# Patient Record
Sex: Female | Born: 1935 | Race: White | Hispanic: No | Smoking: Former smoker
Health system: Southern US, Community
[De-identification: ages and names within clinical notes are randomized; demographics above are authoritative.]

## PROBLEM LIST (undated history)

## (undated) DIAGNOSIS — E785 Hyperlipidemia, unspecified: Secondary | ICD-10-CM

## (undated) DIAGNOSIS — H3530 Unspecified macular degeneration: Secondary | ICD-10-CM

## (undated) HISTORY — PX: EYE SURGERY: SHX253

## (undated) HISTORY — PX: TONSILLECTOMY: SUR1361

---

## 2005-06-14 ENCOUNTER — Ambulatory Visit: Payer: Self-pay | Admitting: Internal Medicine

## 2005-11-25 ENCOUNTER — Ambulatory Visit: Payer: Self-pay | Admitting: Gastroenterology

## 2006-06-20 ENCOUNTER — Ambulatory Visit: Payer: Self-pay | Admitting: Internal Medicine

## 2006-06-23 ENCOUNTER — Ambulatory Visit: Payer: Self-pay | Admitting: Internal Medicine

## 2007-06-25 ENCOUNTER — Ambulatory Visit: Payer: Self-pay | Admitting: Internal Medicine

## 2008-06-26 ENCOUNTER — Ambulatory Visit: Payer: Self-pay | Admitting: Internal Medicine

## 2009-06-29 ENCOUNTER — Ambulatory Visit: Payer: Self-pay | Admitting: Internal Medicine

## 2010-07-13 ENCOUNTER — Ambulatory Visit: Payer: Self-pay | Admitting: Internal Medicine

## 2011-05-06 ENCOUNTER — Ambulatory Visit: Payer: Self-pay | Admitting: Ophthalmology

## 2011-05-16 ENCOUNTER — Ambulatory Visit: Payer: Self-pay | Admitting: Ophthalmology

## 2011-07-04 ENCOUNTER — Ambulatory Visit: Payer: Self-pay | Admitting: Ophthalmology

## 2011-08-10 ENCOUNTER — Ambulatory Visit: Payer: Self-pay | Admitting: Internal Medicine

## 2012-08-16 ENCOUNTER — Ambulatory Visit: Payer: Self-pay | Admitting: Internal Medicine

## 2013-08-19 ENCOUNTER — Ambulatory Visit: Payer: Self-pay | Admitting: Internal Medicine

## 2014-07-16 DIAGNOSIS — N390 Urinary tract infection, site not specified: Secondary | ICD-10-CM | POA: Insufficient documentation

## 2014-08-21 ENCOUNTER — Ambulatory Visit: Payer: Self-pay | Admitting: Internal Medicine

## 2017-06-20 ENCOUNTER — Other Ambulatory Visit: Payer: Self-pay | Admitting: Family Medicine

## 2017-06-20 DIAGNOSIS — R590 Localized enlarged lymph nodes: Secondary | ICD-10-CM

## 2017-06-30 ENCOUNTER — Ambulatory Visit
Admission: RE | Admit: 2017-06-30 | Discharge: 2017-06-30 | Disposition: A | Discharge status: Home / Self Care | Payer: Medicare Other | Source: Ambulatory Visit | Attending: Family Medicine | Admitting: Family Medicine

## 2017-06-30 DIAGNOSIS — I700 Atherosclerosis of aorta: Secondary | ICD-10-CM | POA: Insufficient documentation

## 2017-06-30 DIAGNOSIS — R590 Localized enlarged lymph nodes: Secondary | ICD-10-CM | POA: Insufficient documentation

## 2017-06-30 DIAGNOSIS — I2510 Atherosclerotic heart disease of native coronary artery without angina pectoris: Secondary | ICD-10-CM | POA: Insufficient documentation

## 2017-06-30 LAB — POCT I-STAT CREATININE: Creatinine, Ser: 0.7 mg/dL (ref 0.44–1.00)

## 2017-06-30 MED ORDER — IOPAMIDOL (ISOVUE-300) INJECTION 61%
75.0000 mL | Freq: Once | INTRAVENOUS | Status: AC | PRN
  Administered 2017-06-30: 75 mL via INTRAVENOUS

## 2017-07-03 DIAGNOSIS — E782 Mixed hyperlipidemia: Secondary | ICD-10-CM | POA: Insufficient documentation

## 2017-08-03 DIAGNOSIS — R7989 Other specified abnormal findings of blood chemistry: Secondary | ICD-10-CM | POA: Insufficient documentation

## 2018-03-04 DIAGNOSIS — M1611 Unilateral primary osteoarthritis, right hip: Secondary | ICD-10-CM | POA: Insufficient documentation

## 2018-05-02 ENCOUNTER — Encounter
Admission: RE | Admit: 2018-05-02 | Discharge: 2018-05-02 | Disposition: A | Discharge status: Home / Self Care | Payer: Medicare Other | Source: Ambulatory Visit | Attending: Orthopedic Surgery | Admitting: Orthopedic Surgery

## 2018-05-02 ENCOUNTER — Other Ambulatory Visit: Payer: Self-pay

## 2018-05-02 DIAGNOSIS — Z01810 Encounter for preprocedural cardiovascular examination: Secondary | ICD-10-CM | POA: Insufficient documentation

## 2018-05-02 DIAGNOSIS — Z01812 Encounter for preprocedural laboratory examination: Secondary | ICD-10-CM | POA: Diagnosis present

## 2018-05-02 DIAGNOSIS — E7800 Pure hypercholesterolemia, unspecified: Secondary | ICD-10-CM | POA: Insufficient documentation

## 2018-05-02 HISTORY — DX: Unspecified macular degeneration: H35.30

## 2018-05-02 HISTORY — DX: Hyperlipidemia, unspecified: E78.5

## 2018-05-02 LAB — CBC
HCT: 41.8 % (ref 35.0–47.0)
Hemoglobin: 13.9 g/dL (ref 12.0–16.0)
MCH: 31.6 pg (ref 26.0–34.0)
MCHC: 33.3 g/dL (ref 32.0–36.0)
MCV: 94.7 fL (ref 80.0–100.0)
PLATELETS: 203 10*3/uL (ref 150–440)
RBC: 4.41 MIL/uL (ref 3.80–5.20)
RDW: 14 % (ref 11.5–14.5)
WBC: 8.1 10*3/uL (ref 3.6–11.0)

## 2018-05-02 LAB — APTT: aPTT: 26 seconds (ref 24–36)

## 2018-05-02 LAB — TYPE AND SCREEN
ABO/RH(D): O POS
ANTIBODY SCREEN: NEGATIVE

## 2018-05-02 LAB — SURGICAL PCR SCREEN
MRSA, PCR: NEGATIVE
STAPHYLOCOCCUS AUREUS: NEGATIVE

## 2018-05-02 LAB — COMPREHENSIVE METABOLIC PANEL
ALT: 11 U/L — ABNORMAL LOW (ref 14–54)
AST: 19 U/L (ref 15–41)
Albumin: 4.9 g/dL (ref 3.5–5.0)
Alkaline Phosphatase: 74 U/L (ref 38–126)
Anion gap: 10 (ref 5–15)
BUN: 16 mg/dL (ref 6–20)
CO2: 26 mmol/L (ref 22–32)
Calcium: 9.1 mg/dL (ref 8.9–10.3)
Chloride: 105 mmol/L (ref 101–111)
Creatinine, Ser: 0.69 mg/dL (ref 0.44–1.00)
Glucose, Bld: 99 mg/dL (ref 65–99)
POTASSIUM: 3.5 mmol/L (ref 3.5–5.1)
Sodium: 141 mmol/L (ref 135–145)
Total Bilirubin: 1 mg/dL (ref 0.3–1.2)
Total Protein: 7.5 g/dL (ref 6.5–8.1)

## 2018-05-02 LAB — C-REACTIVE PROTEIN

## 2018-05-02 LAB — PROTIME-INR
INR: 0.94
PROTHROMBIN TIME: 12.5 s (ref 11.4–15.2)

## 2018-05-02 LAB — SEDIMENTATION RATE: Sed Rate: 9 mm/hr (ref 0–30)

## 2018-05-02 NOTE — Pre-Procedure Instructions (Signed)
Urine specimen cup and wipes provided to pt and caregiver with instructions for cc and storage in refrigerator before transport. Pt will drop off urine sample at PAT before Ortho appt on 6/18.

## 2018-05-02 NOTE — Patient Instructions (Signed)
  Your procedure is scheduled on: Monday May 14, 2018 Report to Same Day Surgery 2nd floor medical mall (Medical Mall Entrance-take elevator on left to 2nd floor.  Check in with surgery information desk.) To find out your arrival time please call [phone] between 1PM - 3PM on Friday May 11, 2018  Remember: Instructions that are not followed completely may result in serious medical risk, up to and including death, or upon the discretion of your surgeon and anesthesiologist your surgery may need to be rescheduled.    _x___ 1. Do not eat food after midnight the night before your procedure. You may drink clear liquids up to 2 hours before you are scheduled to arrive at the hospital for your procedure.  Do not drink clear liquids within 2 hours of your scheduled arrival to the hospital.  Clear liquids include  --Water or Apple juice without pulp  --Clear carbohydrate beverage such as Gatorade  --Black Coffee or Clear Tea (No milk, no creamers, do not add anything to the coffee or tea   No gum chewing or hard candies.      __x__ 2. No Alcohol for 24 hours before or after surgery.   __x__3. No Smoking or e-cigarettes for 24 prior to surgery.  Do not use any chewable tobacco products for at least 6 hour prior to surgery   ____  4. Bring all medications with you on the day of surgery if instructed.    __x__ 5. Notify your doctor if there is any change in your medical condition     (cold, fever, infections).   __x__6. On the morning of surgery brush your teeth with toothpaste and water.  You may rinse your mouth with mouth wash if you wish.  Do not swallow any toothpaste or mouthwash.   Do not wear jewelry, make-up, hairpins, clips or nail polish.  Do not wear lotions, powders, deodorant, or perfumes.   Do not shave 48 hours prior to surgery.   Do not bring valuables to the hospital.    [facility] any belongings or valuables.               Contacts, dentures or  bridgework may not be worn into surgery.  Leave your suitcase in the car. After surgery it may be brought to your room.  For patients admitted to the hospital, discharge time is determined by your treatment team.  Please read over the following fact sheets that you were given:   [facility] Preparing for Surgery and or MRSA Information   _x___ Take anti-hypertensive listed below, cardiac, seizure, asthma, anti-reflux and psychiatric medicines. These include:  1. Nitrofurantoin/Macrodantin  2. Pravastatin/Pravachol  _x___ Use CHG Soap or sage wipes as directed on instruction sheet   _x___ Follow recommendations from Cardiologist, Pulmonologist or PCP regarding stopping Aspirin, Coumadin, Plavix ,Eliquis, Effient, or Pradaxa, and Pletal.  _x___ Stop Anti-inflammatories such as Advil, Aleve, Ibuprofen, Motrin, Naproxen, Naprosyn, Goodies powders or aspirin products. OK to take Tylenol and Celebrex.   _x___ Stop supplements until after surgery. But may continue Vitamin D, Vitamin B, and multivitamin.

## 2018-05-08 ENCOUNTER — Ambulatory Visit
Admission: RE | Admit: 2018-05-08 | Discharge: 2018-05-08 | Disposition: A | Discharge status: Home / Self Care | Payer: Medicare Other | Source: Ambulatory Visit | Attending: Orthopedic Surgery | Admitting: Orthopedic Surgery

## 2018-05-08 DIAGNOSIS — E785 Hyperlipidemia, unspecified: Secondary | ICD-10-CM | POA: Insufficient documentation

## 2018-05-08 DIAGNOSIS — H3530 Unspecified macular degeneration: Secondary | ICD-10-CM | POA: Diagnosis not present

## 2018-05-08 DIAGNOSIS — M1990 Unspecified osteoarthritis, unspecified site: Secondary | ICD-10-CM | POA: Insufficient documentation

## 2018-05-08 DIAGNOSIS — Z01812 Encounter for preprocedural laboratory examination: Secondary | ICD-10-CM | POA: Diagnosis not present

## 2018-05-08 LAB — URINALYSIS, ROUTINE W REFLEX MICROSCOPIC
BACTERIA UA: NONE SEEN
Bilirubin Urine: NEGATIVE
Glucose, UA: NEGATIVE mg/dL
Hgb urine dipstick: NEGATIVE
Ketones, ur: NEGATIVE mg/dL
Nitrite: NEGATIVE
PROTEIN: NEGATIVE mg/dL
SPECIFIC GRAVITY, URINE: 1.011 (ref 1.005–1.030)
SQUAMOUS EPITHELIAL / LPF: NONE SEEN (ref 0–5)
pH: 6 (ref 5.0–8.0)

## 2018-05-09 LAB — URINE CULTURE: Culture: NO GROWTH

## 2018-05-13 MED ORDER — CEFAZOLIN SODIUM-DEXTROSE 2-4 GM/100ML-% IV SOLN
2.0000 g | INTRAVENOUS | Status: AC
  Administered 2018-05-14: 2 g via INTRAVENOUS

## 2018-05-13 MED ORDER — SODIUM CHLORIDE 0.9 % IV SOLN
1000.0000 mg | INTRAVENOUS | Status: AC
  Administered 2018-05-14: 1000 mg via INTRAVENOUS
  Filled 2018-05-13: qty 1100

## 2018-05-14 ENCOUNTER — Encounter
Admission: RE | Disposition: A | Discharge status: Skilled Nursing Facility | Payer: Self-pay | Source: Home / Self Care | Attending: Orthopedic Surgery

## 2018-05-14 ENCOUNTER — Inpatient Hospital Stay: Payer: Medicare Other

## 2018-05-14 ENCOUNTER — Encounter: Payer: Self-pay | Admitting: Orthopedic Surgery

## 2018-05-14 ENCOUNTER — Inpatient Hospital Stay: Payer: Medicare Other | Admitting: Anesthesiology

## 2018-05-14 ENCOUNTER — Other Ambulatory Visit: Payer: Self-pay

## 2018-05-14 ENCOUNTER — Inpatient Hospital Stay
Admission: RE | Admit: 2018-05-14 | Discharge: 2018-05-16 | DRG: 470 | Disposition: A | Discharge status: Skilled Nursing Facility | Payer: Medicare Other | Attending: Orthopedic Surgery | Admitting: Orthopedic Surgery

## 2018-05-14 DIAGNOSIS — Z96649 Presence of unspecified artificial hip joint: Secondary | ICD-10-CM

## 2018-05-14 DIAGNOSIS — Z881 Allergy status to other antibiotic agents status: Secondary | ICD-10-CM

## 2018-05-14 DIAGNOSIS — E785 Hyperlipidemia, unspecified: Secondary | ICD-10-CM | POA: Diagnosis present

## 2018-05-14 DIAGNOSIS — H3530 Unspecified macular degeneration: Secondary | ICD-10-CM | POA: Diagnosis present

## 2018-05-14 DIAGNOSIS — Z9842 Cataract extraction status, left eye: Secondary | ICD-10-CM

## 2018-05-14 DIAGNOSIS — Z9841 Cataract extraction status, right eye: Secondary | ICD-10-CM | POA: Diagnosis not present

## 2018-05-14 DIAGNOSIS — M419 Scoliosis, unspecified: Secondary | ICD-10-CM | POA: Diagnosis present

## 2018-05-14 DIAGNOSIS — Z87440 Personal history of urinary (tract) infections: Secondary | ICD-10-CM | POA: Diagnosis not present

## 2018-05-14 DIAGNOSIS — M1611 Unilateral primary osteoarthritis, right hip: Secondary | ICD-10-CM | POA: Diagnosis present

## 2018-05-14 HISTORY — PX: TOTAL HIP ARTHROPLASTY: SHX124

## 2018-05-14 LAB — ABO/RH: ABO/RH(D): O POS

## 2018-05-14 SURGERY — ARTHROPLASTY, HIP, TOTAL,POSTERIOR APPROACH
Anesthesia: Spinal | Site: Hip | Laterality: Right | Wound class: Clean

## 2018-05-14 MED ORDER — DIPHENHYDRAMINE HCL 12.5 MG/5ML PO ELIX: 12.5000 mg | ORAL_SOLUTION | ORAL | Status: DC | PRN

## 2018-05-14 MED ORDER — SENNOSIDES-DOCUSATE SODIUM 8.6-50 MG PO TABS
1.0000 | ORAL_TABLET | Freq: Two times a day (BID) | ORAL | Status: DC
  Administered 2018-05-14 – 2018-05-15 (×3): 1 via ORAL
  Filled 2018-05-14 (×4): qty 1

## 2018-05-14 MED ORDER — CHLORHEXIDINE GLUCONATE 4 % EX LIQD: 60.0000 mL | Freq: Once | CUTANEOUS | Status: DC

## 2018-05-14 MED ORDER — ACETAMINOPHEN 10 MG/ML IV SOLN
INTRAVENOUS | Status: DC | PRN
  Administered 2018-05-14: 1000 mg via INTRAVENOUS

## 2018-05-14 MED ORDER — GABAPENTIN 300 MG PO CAPS
300.0000 mg | ORAL_CAPSULE | Freq: Every day | ORAL | Status: DC
  Administered 2018-05-14 – 2018-05-15 (×2): 300 mg via ORAL
  Filled 2018-05-14 (×2): qty 1

## 2018-05-14 MED ORDER — GABAPENTIN 300 MG PO CAPS
300.0000 mg | ORAL_CAPSULE | Freq: Once | ORAL | Status: AC
  Administered 2018-05-14: 300 mg via ORAL

## 2018-05-14 MED ORDER — METOCLOPRAMIDE HCL 10 MG PO TABS
10.0000 mg | ORAL_TABLET | Freq: Three times a day (TID) | ORAL | Status: AC
  Administered 2018-05-14 – 2018-05-16 (×7): 10 mg via ORAL
  Filled 2018-05-14 (×7): qty 1

## 2018-05-14 MED ORDER — FLEET ENEMA 7-19 GM/118ML RE ENEM: 1.0000 | ENEMA | Freq: Once | RECTAL | Status: DC | PRN

## 2018-05-14 MED ORDER — METOCLOPRAMIDE HCL 10 MG PO TABS: 5.0000 mg | ORAL_TABLET | Freq: Three times a day (TID) | ORAL | Status: DC | PRN

## 2018-05-14 MED ORDER — FAMOTIDINE 20 MG PO TABS
20.0000 mg | ORAL_TABLET | Freq: Once | ORAL | Status: AC
  Administered 2018-05-14: 20 mg via ORAL

## 2018-05-14 MED ORDER — MAGNESIUM HYDROXIDE 400 MG/5ML PO SUSP
30.0000 mL | Freq: Every day | ORAL | Status: DC
  Administered 2018-05-15: 30 mL via ORAL
  Filled 2018-05-14: qty 30

## 2018-05-14 MED ORDER — NEOMYCIN-POLYMYXIN B GU 40-200000 IR SOLN
Status: AC
  Filled 2018-05-14: qty 20

## 2018-05-14 MED ORDER — TRANEXAMIC ACID 1000 MG/10ML IV SOLN
1000.0000 mg | Freq: Once | INTRAVENOUS | Status: AC
  Administered 2018-05-14: 1000 mg via INTRAVENOUS
  Filled 2018-05-14: qty 1100

## 2018-05-14 MED ORDER — ONDANSETRON HCL 4 MG/2ML IJ SOLN: 4.0000 mg | Freq: Once | INTRAMUSCULAR | Status: DC | PRN

## 2018-05-14 MED ORDER — FAMOTIDINE 20 MG PO TABS
ORAL_TABLET | ORAL | Status: AC
  Filled 2018-05-14: qty 1

## 2018-05-14 MED ORDER — WHITE PETROLATUM EX OINT
TOPICAL_OINTMENT | CUTANEOUS | Status: AC
  Filled 2018-05-14: qty 5

## 2018-05-14 MED ORDER — PANTOPRAZOLE SODIUM 40 MG PO TBEC
40.0000 mg | DELAYED_RELEASE_TABLET | Freq: Two times a day (BID) | ORAL | Status: DC
  Administered 2018-05-14 – 2018-05-16 (×4): 40 mg via ORAL
  Filled 2018-05-14 (×4): qty 1

## 2018-05-14 MED ORDER — ACETAMINOPHEN 325 MG PO TABS
325.0000 mg | ORAL_TABLET | Freq: Four times a day (QID) | ORAL | Status: DC | PRN
  Administered 2018-05-16: 650 mg via ORAL
  Filled 2018-05-14 (×2): qty 2

## 2018-05-14 MED ORDER — BUPIVACAINE HCL (PF) 0.5 % IJ SOLN
INTRAMUSCULAR | Status: DC | PRN
  Administered 2018-05-14: 2.7 mL

## 2018-05-14 MED ORDER — ACETAMINOPHEN 10 MG/ML IV SOLN
1000.0000 mg | Freq: Four times a day (QID) | INTRAVENOUS | Status: AC
  Administered 2018-05-14 – 2018-05-15 (×3): 1000 mg via INTRAVENOUS
  Filled 2018-05-14 (×6): qty 100

## 2018-05-14 MED ORDER — DEXAMETHASONE SODIUM PHOSPHATE 10 MG/ML IJ SOLN
8.0000 mg | Freq: Once | INTRAMUSCULAR | Status: AC
  Administered 2018-05-14: 8 mg via INTRAVENOUS

## 2018-05-14 MED ORDER — CELECOXIB 200 MG PO CAPS
ORAL_CAPSULE | ORAL | Status: AC
  Filled 2018-05-14: qty 2

## 2018-05-14 MED ORDER — FENTANYL CITRATE (PF) 100 MCG/2ML IJ SOLN: 25.0000 ug | INTRAMUSCULAR | Status: DC | PRN

## 2018-05-14 MED ORDER — PROPOFOL 500 MG/50ML IV EMUL
INTRAVENOUS | Status: AC
  Filled 2018-05-14: qty 50

## 2018-05-14 MED ORDER — LACTATED RINGERS IV SOLN: INTRAVENOUS | Status: DC

## 2018-05-14 MED ORDER — NEOMYCIN-POLYMYXIN B GU 40-200000 IR SOLN
Status: DC | PRN
  Administered 2018-05-14: 14 mL

## 2018-05-14 MED ORDER — VITAMIN D 1000 UNITS PO TABS
5000.0000 [IU] | ORAL_TABLET | Freq: Every day | ORAL | Status: DC
  Administered 2018-05-15 – 2018-05-16 (×2): 5000 [IU] via ORAL
  Filled 2018-05-14 (×2): qty 5

## 2018-05-14 MED ORDER — LIDOCAINE HCL (PF) 2 % IJ SOLN
INTRAMUSCULAR | Status: AC
  Filled 2018-05-14: qty 10

## 2018-05-14 MED ORDER — OXYCODONE HCL 5 MG PO TABS: 5.0000 mg | ORAL_TABLET | ORAL | Status: DC | PRN

## 2018-05-14 MED ORDER — ADULT MULTIVITAMIN W/MINERALS CH
1.0000 | ORAL_TABLET | Freq: Every day | ORAL | Status: DC
  Administered 2018-05-15 – 2018-05-16 (×2): 1 via ORAL
  Filled 2018-05-14 (×2): qty 1

## 2018-05-14 MED ORDER — PHENYLEPHRINE HCL 10 MG/ML IJ SOLN
INTRAMUSCULAR | Status: AC
  Filled 2018-05-14: qty 1

## 2018-05-14 MED ORDER — CELECOXIB 200 MG PO CAPS
400.0000 mg | ORAL_CAPSULE | Freq: Once | ORAL | Status: AC
  Administered 2018-05-14: 400 mg via ORAL

## 2018-05-14 MED ORDER — ONDANSETRON HCL 4 MG/2ML IJ SOLN: 4.0000 mg | Freq: Four times a day (QID) | INTRAMUSCULAR | Status: DC | PRN

## 2018-05-14 MED ORDER — PROPOFOL 10 MG/ML IV BOLUS
INTRAVENOUS | Status: DC | PRN
  Administered 2018-05-14 (×4): 11 mg via INTRAVENOUS

## 2018-05-14 MED ORDER — GLYCOPYRROLATE 0.2 MG/ML IJ SOLN
INTRAMUSCULAR | Status: DC | PRN
  Administered 2018-05-14: 0.2 mg via INTRAVENOUS

## 2018-05-14 MED ORDER — OXYCODONE HCL 5 MG PO TABS: 10.0000 mg | ORAL_TABLET | ORAL | Status: DC | PRN

## 2018-05-14 MED ORDER — ACETAMINOPHEN 10 MG/ML IV SOLN
INTRAVENOUS | Status: AC
  Filled 2018-05-14: qty 100

## 2018-05-14 MED ORDER — DEXAMETHASONE SODIUM PHOSPHATE 10 MG/ML IJ SOLN
INTRAMUSCULAR | Status: AC
  Filled 2018-05-14: qty 1

## 2018-05-14 MED ORDER — SODIUM CHLORIDE 0.9 % IV SOLN
INTRAVENOUS | Status: DC
  Administered 2018-05-14 – 2018-05-15 (×3): via INTRAVENOUS

## 2018-05-14 MED ORDER — CEFAZOLIN SODIUM-DEXTROSE 2-4 GM/100ML-% IV SOLN
2.0000 g | Freq: Four times a day (QID) | INTRAVENOUS | Status: AC
  Administered 2018-05-14 – 2018-05-15 (×3): 2 g via INTRAVENOUS
  Filled 2018-05-14 (×4): qty 100

## 2018-05-14 MED ORDER — MENTHOL 3 MG MT LOZG
1.0000 | LOZENGE | OROMUCOSAL | Status: DC | PRN
  Filled 2018-05-14: qty 9

## 2018-05-14 MED ORDER — PRAVASTATIN SODIUM 20 MG PO TABS
40.0000 mg | ORAL_TABLET | Freq: Every day | ORAL | Status: DC
  Administered 2018-05-14 – 2018-05-15 (×2): 40 mg via ORAL
  Filled 2018-05-14 (×2): qty 2

## 2018-05-14 MED ORDER — ALUM & MAG HYDROXIDE-SIMETH 200-200-20 MG/5ML PO SUSP: 30.0000 mL | ORAL | Status: DC | PRN

## 2018-05-14 MED ORDER — HYDROMORPHONE HCL 1 MG/ML IJ SOLN: 0.5000 mg | INTRAMUSCULAR | Status: DC | PRN

## 2018-05-14 MED ORDER — GABAPENTIN 300 MG PO CAPS
ORAL_CAPSULE | ORAL | Status: AC
  Filled 2018-05-14: qty 1

## 2018-05-14 MED ORDER — ONDANSETRON HCL 4 MG PO TABS: 4.0000 mg | ORAL_TABLET | Freq: Four times a day (QID) | ORAL | Status: DC | PRN

## 2018-05-14 MED ORDER — CLOBETASOL PROPIONATE 0.05 % EX OINT
1.0000 "application " | TOPICAL_OINTMENT | Freq: Two times a day (BID) | CUTANEOUS | Status: DC
  Administered 2018-05-14 – 2018-05-15 (×2): 1 via TOPICAL
  Filled 2018-05-14: qty 15

## 2018-05-14 MED ORDER — FENTANYL CITRATE (PF) 100 MCG/2ML IJ SOLN
INTRAMUSCULAR | Status: DC | PRN
  Administered 2018-05-14: 50 ug via INTRAVENOUS

## 2018-05-14 MED ORDER — CEFAZOLIN SODIUM-DEXTROSE 2-4 GM/100ML-% IV SOLN
INTRAVENOUS | Status: AC
  Filled 2018-05-14: qty 100

## 2018-05-14 MED ORDER — PROPOFOL 500 MG/50ML IV EMUL
INTRAVENOUS | Status: DC | PRN
  Administered 2018-05-14: 60 ug/kg/min via INTRAVENOUS

## 2018-05-14 MED ORDER — SODIUM CHLORIDE 0.9 % IV SOLN
INTRAVENOUS | Status: DC | PRN
  Administered 2018-05-14 (×2): via INTRAVENOUS

## 2018-05-14 MED ORDER — FENTANYL CITRATE (PF) 100 MCG/2ML IJ SOLN
INTRAMUSCULAR | Status: AC
  Filled 2018-05-14: qty 2

## 2018-05-14 MED ORDER — ENOXAPARIN SODIUM 30 MG/0.3ML ~~LOC~~ SOLN
30.0000 mg | Freq: Two times a day (BID) | SUBCUTANEOUS | Status: DC
  Administered 2018-05-15 – 2018-05-16 (×3): 30 mg via SUBCUTANEOUS
  Filled 2018-05-14 (×3): qty 0.3

## 2018-05-14 MED ORDER — METOCLOPRAMIDE HCL 5 MG/ML IJ SOLN: 5.0000 mg | Freq: Three times a day (TID) | INTRAMUSCULAR | Status: DC | PRN

## 2018-05-14 MED ORDER — BISACODYL 10 MG RE SUPP: 10.0000 mg | Freq: Every day | RECTAL | Status: DC | PRN

## 2018-05-14 MED ORDER — SODIUM CHLORIDE 0.9 % IV SOLN
INTRAVENOUS | Status: DC | PRN
  Administered 2018-05-14: 30 ug/min via INTRAVENOUS

## 2018-05-14 MED ORDER — TRAMADOL HCL 50 MG PO TABS
50.0000 mg | ORAL_TABLET | ORAL | Status: DC | PRN
  Administered 2018-05-14: 50 mg via ORAL
  Filled 2018-05-14: qty 1

## 2018-05-14 MED ORDER — NITROFURANTOIN MACROCRYSTAL 50 MG PO CAPS
100.0000 mg | ORAL_CAPSULE | Freq: Every day | ORAL | Status: DC
  Administered 2018-05-15 – 2018-05-16 (×2): 100 mg via ORAL
  Filled 2018-05-14 (×3): qty 2

## 2018-05-14 MED ORDER — CELECOXIB 200 MG PO CAPS
200.0000 mg | ORAL_CAPSULE | Freq: Two times a day (BID) | ORAL | Status: DC
  Administered 2018-05-14 – 2018-05-16 (×4): 200 mg via ORAL
  Filled 2018-05-14 (×4): qty 1

## 2018-05-14 MED ORDER — GLYCOPYRROLATE 0.2 MG/ML IJ SOLN
INTRAMUSCULAR | Status: AC
  Filled 2018-05-14: qty 1

## 2018-05-14 MED ORDER — FERROUS SULFATE 325 (65 FE) MG PO TABS
325.0000 mg | ORAL_TABLET | Freq: Two times a day (BID) | ORAL | Status: DC
  Administered 2018-05-15 – 2018-05-16 (×3): 325 mg via ORAL
  Filled 2018-05-14 (×3): qty 1

## 2018-05-14 MED ORDER — PHENOL 1.4 % MT LIQD
1.0000 | OROMUCOSAL | Status: DC | PRN
  Filled 2018-05-14: qty 177

## 2018-05-14 SURGICAL SUPPLY — 55 items
BLADE DRUM FLTD (BLADE) ×3 IMPLANT
BLADE SAW 1 (BLADE) ×3 IMPLANT
CANISTER SUCT 1200ML W/VALVE (MISCELLANEOUS) ×3 IMPLANT
CANISTER SUCT 3000ML PPV (MISCELLANEOUS) ×6 IMPLANT
CAPT HIP TOTAL 2 ×3 IMPLANT
CARTRIDGE OIL MAESTRO DRILL (MISCELLANEOUS) ×1 IMPLANT
DIFFUSER DRILL AIR PNEUMATIC (MISCELLANEOUS) ×3 IMPLANT
DRAPE INCISE IOBAN 66X60 STRL (DRAPES) ×3 IMPLANT
DRAPE SHEET LG 3/4 BI-LAMINATE (DRAPES) ×3 IMPLANT
DRSG DERMACEA 8X12 NADH (GAUZE/BANDAGES/DRESSINGS) ×3 IMPLANT
DRSG OPSITE POSTOP 4X12 (GAUZE/BANDAGES/DRESSINGS) ×3 IMPLANT
DRSG OPSITE POSTOP 4X14 (GAUZE/BANDAGES/DRESSINGS) IMPLANT
DRSG TEGADERM 4X4.75 (GAUZE/BANDAGES/DRESSINGS) ×3 IMPLANT
DURAPREP 26ML APPLICATOR (WOUND CARE) ×6 IMPLANT
ELECT BLADE 6.5 EXT (BLADE) ×3 IMPLANT
ELECT CAUTERY BLADE 6.4 (BLADE) ×3 IMPLANT
GLOVE BIOGEL M STRL SZ7.5 (GLOVE) ×6 IMPLANT
GLOVE BIOGEL PI IND STRL 7.0 (GLOVE) ×4 IMPLANT
GLOVE BIOGEL PI IND STRL 9 (GLOVE) ×1 IMPLANT
GLOVE BIOGEL PI INDICATOR 7.0 (GLOVE) ×8
GLOVE BIOGEL PI INDICATOR 9 (GLOVE) ×2
GLOVE INDICATOR 8.0 STRL GRN (GLOVE) ×3 IMPLANT
GLOVE SURG SYN 9.0  PF PI (GLOVE) ×2
GLOVE SURG SYN 9.0 PF PI (GLOVE) ×1 IMPLANT
GOWN STRL REUS W/ TWL LRG LVL3 (GOWN DISPOSABLE) ×2 IMPLANT
GOWN STRL REUS W/TWL 2XL LVL3 (GOWN DISPOSABLE) ×3 IMPLANT
GOWN STRL REUS W/TWL LRG LVL3 (GOWN DISPOSABLE) ×4
HEMOVAC 400CC 10FR (MISCELLANEOUS) ×3 IMPLANT
HOLDER FOLEY CATH W/STRAP (MISCELLANEOUS) ×3 IMPLANT
HOOD PEEL AWAY FLYTE STAYCOOL (MISCELLANEOUS) ×6 IMPLANT
KIT TURNOVER KIT A (KITS) ×3 IMPLANT
NDL SAFETY ECLIPSE 18X1.5 (NEEDLE) ×1 IMPLANT
NEEDLE HYPO 18GX1.5 SHARP (NEEDLE) ×2
NS IRRIG 500ML POUR BTL (IV SOLUTION) ×3 IMPLANT
OIL CARTRIDGE MAESTRO DRILL (MISCELLANEOUS) ×3
PACK HIP PROSTHESIS (MISCELLANEOUS) ×3 IMPLANT
PIN STEIN THRED 5/32 (Pin) ×3 IMPLANT
PULSAVAC PLUS IRRIG FAN TIP (DISPOSABLE) ×3
SOL .9 NS 3000ML IRR  AL (IV SOLUTION) ×2
SOL .9 NS 3000ML IRR UROMATIC (IV SOLUTION) ×1 IMPLANT
SOL PREP PVP 2OZ (MISCELLANEOUS) ×3
SOLUTION PREP PVP 2OZ (MISCELLANEOUS) ×1 IMPLANT
SPONGE DRAIN TRACH 4X4 STRL 2S (GAUZE/BANDAGES/DRESSINGS) ×3 IMPLANT
STAPLER SKIN PROX 35W (STAPLE) ×3 IMPLANT
SUT ETHIBOND #5 BRAIDED 30INL (SUTURE) ×3 IMPLANT
SUT VIC AB 0 CT1 36 (SUTURE) ×3 IMPLANT
SUT VIC AB 1 CT1 36 (SUTURE) ×6 IMPLANT
SUT VIC AB 2-0 CT1 27 (SUTURE) ×2
SUT VIC AB 2-0 CT1 TAPERPNT 27 (SUTURE) ×1 IMPLANT
SYR 20CC LL (SYRINGE) ×3 IMPLANT
TAPE ADH 3 LX (MISCELLANEOUS) ×3 IMPLANT
TAPE TRANSPORE STRL 2 31045 (GAUZE/BANDAGES/DRESSINGS) ×3 IMPLANT
TIP FAN IRRIG PULSAVAC PLUS (DISPOSABLE) ×1 IMPLANT
TOWEL OR 17X26 4PK STRL BLUE (TOWEL DISPOSABLE) ×3 IMPLANT
TRAY FOLEY MTR SLVR 16FR STAT (SET/KITS/TRAYS/PACK) ×3 IMPLANT

## 2018-05-14 NOTE — Anesthesia Preprocedure Evaluation (Addendum)
Anesthesia Evaluation  Patient identified by MRN, date of birth, ID band Patient awake    Reviewed: Allergy & Precautions, NPO status , Patient's Chart, lab work & pertinent test results  Airway Mallampati: III  TM Distance: <3 FB     Dental   Pulmonary former smoker,    Pulmonary exam normal        Cardiovascular negative cardio ROS Normal cardiovascular exam     Neuro/Psych negative neurological ROS  negative psych ROS   GI/Hepatic negative GI ROS, Neg liver ROS,   Endo/Other  negative endocrine ROS  Renal/GU negative Renal ROS  negative genitourinary   Musculoskeletal  (+) Arthritis , Osteoarthritis,    Abdominal Normal abdominal exam  (+)   Peds negative pediatric ROS (+)  Hematology negative hematology ROS (+)   Anesthesia Other Findings Past Medical History: No date: Hyperlipidemia No date: Macular degeneration  Reproductive/Obstetrics                            Anesthesia Physical Anesthesia Plan  ASA: II  Anesthesia Plan: Spinal   Post-op Pain Management:    Induction: Intravenous  PONV Risk Score and Plan:   Airway Management Planned: Nasal Cannula  Additional Equipment:   Intra-op Plan:   Post-operative Plan:   Informed Consent: I have reviewed the patients History and Physical, chart, labs and discussed the procedure including the risks, benefits and alternatives for the proposed anesthesia with the patient or authorized representative who has indicated his/her understanding and acceptance.   Dental advisory given  Plan Discussed with: CRNA and Surgeon  Anesthesia Plan Comments:         Anesthesia Quick Evaluation

## 2018-05-14 NOTE — Op Note (Signed)
OPERATIVE NOTE  DATE OF SURGERY:  05/14/2018  PATIENT NAME:  Allison Madden   DOB: 01/30/1929  MRN: [id number]  PRE-OPERATIVE DIAGNOSIS: Degenerative arthrosis of the right hip, primary  POST-OPERATIVE DIAGNOSIS:  Same  PROCEDURE:  Right total hip arthroplasty  SURGEON:  Jena GaussJames P Najeeb Uptain, Jr. M.D.  ASSISTANT:  Van ClinesJon Wolfe, PA (present and scrubbed throughout the case, critical for assistance with exposure, retraction, instrumentation, and closure)  ANESTHESIA: spinal  ESTIMATED BLOOD LOSS: 300 mL  FLUIDS REPLACED: 1200 mL of crystalloid  DRAINS: 2 medium drains to a Hemovac reservoir  IMPLANTS UTILIZED: DePuy 12 mm small stature AML femoral stem, 46 mm OD Pinnacle 100 Gription acetabular component, neutral Pinnacle Altrx polyethylene insert, and a 28 mm CoCr +1.5 mm hip ball  INDICATIONS FOR SURGERY: Allison Madden is a 84 y.o. year old female with a long history of progressive hip and groin  pain. X-rays demonstrated severe degenerative changes. The patient had not seen any significant improvement despite conservative nonsurgical intervention. After discussion of the risks and benefits of surgical intervention, the patient expressed understanding of the risks benefits and agree with plans for total hip arthroplasty.   The risks, benefits, and alternatives were discussed at length including but not limited to the risks of infection, bleeding, nerve injury, stiffness, blood clots, the need for revision surgery, limb length inequality, dislocation, cardiopulmonary complications, among others, and they were willing to proceed.  PROCEDURE IN DETAIL: The patient was brought into the operating room and, after adequate spinal anesthesia was achieved, the patient was placed in a left lateral decubitus position. Axillary roll was placed and all bony prominences were well-padded. The patient's right hip was cleaned and prepped with alcohol and DuraPrep and draped in the usual sterile  fashion. A "timeout" was performed as per usual protocol. A lateral curvilinear incision was made gently curving towards the posterior superior iliac spine. The IT band was incised in line with the skin incision and the fibers of the gluteus maximus were split in line. The piriformis tendon was identified, skeletonized, and incised at its insertion to the proximal femur and reflected posteriorly. A T type posterior capsulotomy was performed. Prior to dislocation of the femoral head, a threaded Steinmann pin was inserted through a separate stab incision into the pelvis superior to the acetabulum and bent in the form of a stylus so as to assess limb length and hip offset throughout the procedure. The femoral head was then dislocated posteriorly. Inspection of the femoral head demonstrated severe degenerative changes with full-thickness loss of articular cartilage. The femoral neck cut was performed using an oscillating saw. The anterior capsule was elevated off of the femoral neck using a periosteal elevator. Attention was then directed to the acetabulum. The remnant of the labrum was excised using electrocautery. Inspection of the acetabulum also demonstrated significant degenerative changes. The acetabulum was reamed in sequential fashion up to a 45 mm diameter. Good punctate bleeding bone was encountered. A 46 mm Pinnacle 100 Gription acetabular component was positioned and impacted into place. Good scratch fit was appreciated. A neutral polyethylene trial was inserted.  Attention was then directed to the proximal femur. A hole for reaming of the proximal femoral canal was created using a high-speed burr. The femoral canal was reamed in sequential fashion up to a 11.5 mm diameter. This allowed for approximately 6 cm of scratch fit.  It was thus elected to ream up to a 12 mm diameter to allow for a line to line fit.  Serial broaches were inserted up to a 12 mm small stature femoral broach. Calcar region was planed  and a trial reduction was performed using a 28 mm hip ball with a +1.5 mm neck length.  The right lower extreme was felt to be slightly long so the broach was countersunk several millimeters and the calcar region was again planed.  Trial reduction was again performed using a 28 mm hip ball with a +1.5 mm neck length.  Good equalization of limb lengths and hip offset was appreciated and excellent stability was noted both anteriorly and posteriorly. Trial components were removed. The acetabular shell was irrigated with copious amounts of normal saline with antibiotic solution and suctioned dry. A neutral Pinnacle Altrx polyethylene insert was positioned and impacted into place. Next, a 12 mm small stature AML femoral stem was positioned and impacted into place. Excellent scratch fit was appreciated. A trial reduction was again performed with a 28 mm hip ball with a +1.5 mm neck length. Again, good equalization of limb lengths was appreciated and excellent stability appreciated both anteriorly and posteriorly. The hip was then dislocated and the trial hip ball was removed. The Morse taper was cleaned and dried. A 28 mm cobalt chrome hip ball with a +1.5 mm neck length was placed on the trunnion and impacted into place. The hip was then reduced and placed through range of motion. Excellent stability was appreciated both anteriorly and posteriorly.  The wound was irrigated with copious amounts of normal saline with antibiotic solution and suctioned dry. Good hemostasis was appreciated. The posterior capsulotomy was repaired using #5 Ethibond. Piriformis tendon was reapproximated to the undersurface of the gluteus medius tendon using #5 Ethibond. Two medium drains were placed in the wound bed and brought out through separate stab incisions to be attached to a Hemovac reservoir. The IT band was reapproximated using interrupted sutures of #1 Vicryl. Subcutaneous tissue was approximated using first #0 Vicryl followed by  #2-0 Vicryl. The skin was closed with skin staples.  The patient tolerated the procedure well and was transported to the recovery room in stable condition.   Jena Gauss., M.D.

## 2018-05-14 NOTE — Anesthesia Post-op Follow-up Note (Signed)
Anesthesia QCDR form completed.        

## 2018-05-14 NOTE — Anesthesia Procedure Notes (Signed)
Spinal  Patient location during procedure: OR Start time: 05/14/2018 7:28 AM End time: 05/14/2018 7:31 AM Staffing Resident/CRNA: Bernardo Heater, CRNA Performed: resident/CRNA  Preanesthetic Checklist Completed: patient identified, site marked, surgical consent, pre-op evaluation, timeout performed, IV checked, risks and benefits discussed and monitors and equipment checked Spinal Block Patient position: sitting Prep: Betadine and ChloraPrep Patient monitoring: heart rate, continuous pulse ox, blood pressure and cardiac monitor Approach: midline Location: L4-5 Injection technique: single-shot Needle Needle type: Introducer and Pencan  Needle gauge: 24 G Needle length: 9 cm Additional Notes Negative paresthesia. Negative blood return. Positive free-flowing CSF. Expiration date of kit checked and confirmed. Patient tolerated procedure well, without complications.

## 2018-05-14 NOTE — Transfer of Care (Signed)
Immediate Anesthesia Transfer of Care Note  Patient: Allison Madden  Procedure(s) Performed: TOTAL HIP ARTHROPLASTY (Right Hip)  Patient Location: PACU  Anesthesia Type:Spinal  Level of Consciousness: awake, alert , oriented and patient cooperative  Airway & Oxygen Therapy: Patient Spontanous Breathing and Patient connected to nasal cannula oxygen  Post-op Assessment: Report given to RN and Post -op Vital signs reviewed and stable  Post vital signs: Reviewed and stable  Last Vitals:  Vitals Value Taken Time  BP 124/73 05/14/2018 10:29 AM  Temp    Pulse 66 05/14/2018 10:29 AM  Resp 17 05/14/2018 10:29 AM  SpO2 100 % 05/14/2018 10:29 AM  Vitals shown include unvalidated device data.  Last Pain:  Vitals:   05/14/18 0619  TempSrc:   PainSc: 0-No pain         Complications: No apparent anesthesia complications

## 2018-05-14 NOTE — Progress Notes (Signed)
Patient resting in bed. Dressing dry and intact on right hip. Hemovac in place and intact. IVF infusing. No complaints of pain. Continue to monitor.

## 2018-05-14 NOTE — Evaluation (Signed)
Physical Therapy Evaluation Patient Details Name: Allison Madden MRN: [id number] DOB: July 21, 1929 Today's Date: 05/14/2018   History of Present Illness  Pt is a 84 y/o F s/p R THA.  Pt's PMH includes macular degeneration.   Clinical Impression  Pt is s/p R THA resulting in the deficits listed below (see PT Problem List). Allison Madden was very pleasant and put forth good effort.  She does have a difficult time remembering her posterior precautions and was reminded x3.  She requires min assist for supine>sit and sit<>stand.  Pt reported fatigue after ambulating ~15 ft and ambulated a total of 20 ft.  Given pt's current mobility status, recommending SNF at d/c.  Pt will benefit from skilled PT to increase their independence and safety with mobility to allow discharge to the venue listed below.     Follow Up Recommendations SNF    Equipment Recommendations  Rolling walker with 5" wheels;3in1 (PT)    Recommendations for Other Services OT consult     Precautions / Restrictions Precautions Precautions: Fall;Posterior Hip Precaution Booklet Issued: Yes (comment) Precaution Comments: Reviewed precautions x3 and provided pt with handout (although pt with macular degeneration so packet provided to son).  Pt able to recall only 1/3 hip precautions at end of session.  Restrictions Weight Bearing Restrictions: Yes RLE Weight Bearing: Weight bearing as tolerated      Mobility  Bed Mobility Overal bed mobility: Needs Assistance Bed Mobility: Supine to Sit     Supine to sit: Min assist;HOB elevated     General bed mobility comments: Cues for proper sequencing.  Assist to elevate trunk.  Pt uses bed rail to assist in pulling into sitting.  Well controlled descent to sit.   Transfers Overall transfer level: Needs assistance Equipment used: Rolling walker (2 wheeled) Transfers: Sit to/from Stand Sit to Stand: Min assist;From elevated surface         General transfer comment: Bed  slightly elevated for adherence to hip precautions.  Pt requires min assist to boost to standing and to remain steady.   Ambulation/Gait Ambulation/Gait assistance: Min guard Gait Distance (Feet): 20 Feet Assistive device: Rolling walker (2 wheeled) Gait Pattern/deviations: Step-to pattern;Decreased stance time - right;Decreased step length - left;Decreased weight shift to right;Antalgic Gait velocity: decreased   General Gait Details: Cues for sequencing using RW.  Pt reports fatigue after ambulating ~15 ft.  Slightly flexed posture.   Stairs            Wheelchair Mobility    Modified Rankin (Stroke Patients Only)       Balance Overall balance assessment: Needs assistance Sitting-balance support: No upper extremity supported;Feet supported Sitting balance-Leahy Scale: Fair Sitting balance - Comments: Pt able to sit EOB without UE support but would likely lose her balance with perturbation   Standing balance support: Bilateral upper extremity supported;During functional activity Standing balance-Leahy Scale: Poor Standing balance comment: Pt relies on UE support for static and dynamic activities                             Pertinent Vitals/Pain Pain Assessment: Faces Faces Pain Scale: Hurts little more Pain Location: R hip  Pain Descriptors / Indicators: Grimacing;Guarding;Moaning Pain Intervention(s): Limited activity within patient's tolerance;Monitored during session;Premedicated before session;Repositioned    Home Living Family/patient expects to be discharged to:: Private residence Living Arrangements: Alone Available Help at Discharge: Personal care attendant;Available PRN/intermittently Type of Home: Independent living facility Home Access: Level entry  Home Layout: One level Home Equipment: Shower seat;Grab bars - tub/shower;Walker - 4 wheels;Cane - single point      Prior Function Level of Independence: Needs assistance   Gait /  Transfers Assistance Needed: Pt ambulates with rollator at all times and denies any falls in the past 6 months.    ADL's / Homemaking Assistance Needed: Pt independent with bathing, dressing.  Caregiver does the cleaning, runs the errands, reads the newspaper to the pt.  Meals provided by ILF.   Comments: Pt's caregiver comes M-F 8:30am-2pm     Hand Dominance   Dominant Hand: Left    Extremity/Trunk Assessment   Upper Extremity Assessment Upper Extremity Assessment: Defer to OT evaluation    Lower Extremity Assessment Lower Extremity Assessment: RLE deficits/detail RLE Deficits / Details: Pt able to perform R hip Abd/Add in supine without assist, limited by pain and hip precautions RLE: Unable to fully assess due to pain       Communication   Communication: No difficulties  Cognition Arousal/Alertness: Awake/alert Behavior During Therapy: WFL for tasks assessed/performed Overall Cognitive Status: Impaired/Different from baseline Area of Impairment: Problem solving;Safety/judgement                         Safety/Judgement: Decreased awareness of deficits;Decreased awareness of safety   Problem Solving: Slow processing General Comments: Son reports pt still seems to be affected by the medication and anesthesia.  Pt overall A&O and able to answer questions appropriately and follow commands but with some delay.       General Comments General comments (skin integrity, edema, etc.): BP in supine at start of session: 109/72, sitting EOB 119/70, sitting in chair at end of session 112/68    Exercises Total Joint Exercises Ankle Circles/Pumps: AROM;Both;10 reps;Supine Quad Sets: Strengthening;Both;10 reps;Supine Gluteal Sets: Strengthening;Both;10 reps;Supine Hip ABduction/ADduction: AROM;Right;10 reps;Supine   Assessment/Plan    PT Assessment Patient needs continued PT services  PT Problem List Decreased strength;Decreased range of motion;Decreased activity  tolerance;Decreased balance;Decreased mobility;Decreased knowledge of use of DME;Decreased safety awareness;Decreased knowledge of precautions;Pain       PT Treatment Interventions DME instruction;Gait training;Stair training;Functional mobility training;Therapeutic activities;Therapeutic exercise;Balance training;Neuromuscular re-education;Patient/family education;Wheelchair mobility training;Modalities    PT Goals (Current goals can be found in the Care Plan section)  Acute Rehab PT Goals Patient Stated Goal: to get back to walking PT Goal Formulation: With patient Time For Goal Achievement: 05/28/18 Potential to Achieve Goals: Good    Frequency BID   Barriers to discharge Decreased caregiver support Pt with limited amount of caregiver support    Co-evaluation               AM-PAC PT "6 Clicks" Daily Activity  Outcome Measure Difficulty turning over in bed (including adjusting bedclothes, sheets and blankets)?: Unable Difficulty moving from lying on back to sitting on the side of the bed? : Unable Difficulty sitting down on and standing up from a chair with arms (e.g., wheelchair, bedside commode, etc,.)?: Unable Help needed moving to and from a bed to chair (including a wheelchair)?: A Little Help needed walking in hospital room?: A Little Help needed climbing 3-5 steps with a railing? : A Lot 6 Click Score: 11    End of Session Equipment Utilized During Treatment: Gait belt Activity Tolerance: Patient tolerated treatment well;Patient limited by fatigue;Patient limited by pain Patient left: in chair;with call bell/phone within reach;with chair alarm set;with family/visitor present;with SCD's reapplied Nurse Communication: Mobility status;Precautions PT Visit Diagnosis:  Pain;Difficulty in walking, not elsewhere classified (R26.2);Muscle weakness (generalized) (M62.81);Unsteadiness on feet (R26.81) Pain - Right/Left: Right Pain - part of body: Hip    Time:  [id number] PT Time Calculation (min) (ACUTE ONLY): 43 min   Charges:   PT Evaluation $PT Eval Low Complexity: 1 Low PT Treatments $Therapeutic Exercise: 8-22 mins $Therapeutic Activity: 8-22 mins   PT G Codes:        Encarnacion ChuAshley Nehal Witting PT, DPT 05/14/2018, 4:25 PM

## 2018-05-14 NOTE — Discharge Instructions (Signed)
Instructions after Total Hip Replacement ° ° °  Allison Madden P. Inaara Tye, Jr., M.D.    ° Dept. of Orthopaedics & Sports Medicine ° [facility] ° [address] ° [place], [place]  [address] ° Phone: [phone]   Fax: [phone] ° °  °DIET: °• Drink plenty of non-alcoholic fluids. °• Resume your normal diet. Include foods high in fiber. ° °ACTIVITY:  °• You may use crutches or a walker with weight-bearing as tolerated, unless instructed otherwise. °• You may be weaned off of the walker or crutches by your Physical Therapist.  °• Do NOT reach below the level of your knees or cross your legs until allowed.    °• Continue doing gentle exercises. Exercising will reduce the pain and swelling, increase motion, and prevent muscle weakness.   °• Please continue to use the TED compression stockings for 6 weeks. You may remove the stockings at night, but should reapply them in the morning. °• Do not drive or operate any equipment until instructed. ° °WOUND CARE:  °• Continue to use ice packs periodically to reduce pain and swelling. °• Keep the incision clean and dry. °• You may bathe or shower after the staples are removed at the first office visit following surgery. ° °MEDICATIONS: °• You may resume your regular medications. °• Please take the pain medication as prescribed on the medication. °• Do not take pain medication on an empty stomach. °• You have been given a prescription for a blood thinner to prevent blood clots. Please take the medication as instructed. (NOTE: After completing a 2 week course of Lovenox, take one Enteric-coated aspirin once a day.) °• Pain medications and iron supplements can cause constipation. Use a stool softener (Senokot or Colace) on a daily basis and a laxative (dulcolax or miralax) as needed. °• Do not drive or drink alcoholic beverages when taking pain medications. ° °CALL THE OFFICE FOR: °• Temperature above 101 degrees °• Excessive bleeding or drainage on the dressing. °• Excessive  swelling, coldness, or paleness of the toes. °• Persistent nausea and vomiting. ° °FOLLOW-UP:  °• You should have an appointment to return to the office in 6 weeks after surgery. °• Arrangements have been made for continuation of Physical Therapy (either home therapy or outpatient therapy). °  °

## 2018-05-14 NOTE — H&P (Signed)
The patient has been re-examined, and the chart reviewed, and there have been no interval changes to the documented history and physical.    The risks, benefits, and alternatives have been discussed at length. The patient expressed understanding of the risks benefits and agreed with plans for surgical intervention.  James P. Hooten, Jr. M.D.    

## 2018-05-14 NOTE — Plan of Care (Signed)

## 2018-05-15 ENCOUNTER — Encounter
Admission: RE | Admit: 2018-05-15 | Discharge: 2018-05-15 | Disposition: A | Discharge status: Home / Self Care | Payer: Medicare Other | Source: Ambulatory Visit | Attending: Internal Medicine | Admitting: Internal Medicine

## 2018-05-15 MED ORDER — OXYCODONE HCL 5 MG PO TABS: 5.0000 mg | ORAL_TABLET | ORAL | 0 refills | Status: DC | PRN

## 2018-05-15 MED ORDER — TRAMADOL HCL 50 MG PO TABS: 50.0000 mg | ORAL_TABLET | ORAL | 0 refills | Status: DC | PRN

## 2018-05-15 MED ORDER — ENOXAPARIN SODIUM 30 MG/0.3ML ~~LOC~~ SOLN: 30.0000 mg | Freq: Two times a day (BID) | SUBCUTANEOUS | 0 refills | Status: DC

## 2018-05-15 NOTE — Evaluation (Signed)
Occupational Therapy Evaluation Patient Details Name: Allison Madden MRN: [id number] DOB: 05/10/1929 Today's Date: 05/15/2018    History of Present Illness Pt is a 84 y/o F s/p R THA.  Pt's PMH includes macular degeneration.    Clinical Impression   Pt seen for OT evaluation this date, POD#1 from above surgery. Pt was independent in all basic ADLs prior to surgery, using a rollator for mobility, and received assist from caregiver (M-F 8:30a-2p) for cooking, cleaning, reading the newspaper, groceries, and transportation. Pt is eager to return to PLOF with less pain and improved safety and independence. Pt currently requires CGA and max verbal cues for RW use and to maintain posterior THPs (minimal immediate and delayed recall) for transfers, min assist for ambulating a few feet to [facility] with RW, and mod-max assist for LB ADL tasks in order to maintain precautions. Pt demonstrates impairments in cognition (mildly more confused than baseline, per family), activity tolerance, safety awareness, strength, and ROM leading to functional deficits noted (please see below for more detail). Pt/family/caregiver educated in falls prevention, posterior THPs including how to implement during ADL and mobility, self care skills, home/routines modifications, DME training, compression stocking mgt strategies, and compensatory strategies for the family while pt is having mild cognitive changes acutely after surgery to maximize safety. Pt would benefit from additional instruction in self care skills and techniques to help maintain precautions with or without assistive devices to support recall and carryover prior to discharge. Recommend STR upon discharge.       Follow Up Recommendations  SNF;Supervision/Assistance - 24 hour    Equipment Recommendations  3 in 1 bedside commode    Recommendations for Other Services       Precautions / Restrictions Precautions Precautions: Fall;Posterior Hip Precaution Booklet  Issued: Yes (comment) Precaution Comments: poor immediate and delayed recall of precautions t/o session despite cues/education  Restrictions Weight Bearing Restrictions: Yes RLE Weight Bearing: Weight bearing as tolerated      Mobility Bed Mobility     General bed mobility comments: deferred, up in recliner  Transfers Overall transfer level: Needs assistance Equipment used: Rolling walker (2 wheeled) Transfers: Sit to/from Stand Sit to Stand: Min guard         General transfer comment: educated in hand/foot positioning to maximize safety and maintain posterior THPs (caregiver present and verbalized understanding as well)    Balance Overall balance assessment: Needs assistance Sitting-balance support: No upper extremity supported;Feet supported Sitting balance-Leahy Scale: Fair     Standing balance support: Bilateral upper extremity supported;During functional activity Standing balance-Leahy Scale: Poor                             ADL either performed or assessed with clinical judgement   ADL Overall ADL's : Needs assistance/impaired Eating/Feeding: Sitting;Set up   Grooming: Sitting;Set up   Upper Body Bathing: Sitting;Minimal assistance   Lower Body Bathing: Sit to/from stand;Moderate assistance;Maximal assistance;Cueing for safety   Upper Body Dressing : Sitting;Minimal assistance   Lower Body Dressing: Sit to/from stand;Moderate assistance;Maximal assistance;Cueing for safety   Toilet Transfer: RW;Minimal assistance;Ambulation;BSC;Cueing for safety;Cueing for sequencing Toilet Transfer Details (indicate cue type and reason): max verbal cues for sequencing and maintaining posterior THPs Toileting- Clothing Manipulation and Hygiene: Sitting/lateral lean;Set up;Min guard       Functional mobility during ADLs: Minimal assistance;Rolling walker;Cueing for safety General ADL Comments: requires mod-max cues to maintain posterior THPs     Vision  Baseline  Vision/History: Wears glasses;Macular Degeneration Wears Glasses: At all times Patient Visual Report: No change from baseline       Perception     Praxis      Pertinent Vitals/Pain Pain Assessment: Faces Pain Score: 2  Faces Pain Scale: Hurts little more Pain Location: R hip  Pain Descriptors / Indicators: Aching;Operative site guarding;Restless;Guarding Pain Intervention(s): Limited activity within patient's tolerance;Monitored during session;Patient requesting pain meds-RN notified;Repositioned     Hand Dominance Left   Extremity/Trunk Assessment Upper Extremity Assessment Upper Extremity Assessment: Overall WFL for tasks assessed(grossly WFL for age (at least 4/5 bilaterally))   Lower Extremity Assessment Lower Extremity Assessment: Defer to PT evaluation       Communication Communication Communication: No difficulties   Cognition Arousal/Alertness: Awake/alert Behavior During Therapy: WFL for tasks assessed/performed;Restless Overall Cognitive Status: Impaired/Different from baseline(hx cognitive impairments at baseline as well) Area of Impairment: Problem solving;Safety/judgement;Memory;Following commands                     Memory: Decreased recall of precautions Following Commands: Follows one step commands consistently;Follows one step commands with increased time;Follows multi-step commands inconsistently Safety/Judgement: Decreased awareness of deficits;Decreased awareness of safety   Problem Solving: Slow processing;Requires verbal cues;Requires tactile cues General Comments: family note pt is mildly more confused following surgery than she normally is   General Comments       Exercises Exercises: Other exercises Other Exercises: pt/caregiver/family instructed in compression stocking mgt with verbal instruction and visual demo - verbalized understanding Other Exercises: pt/caregiver/family educated in falls prevention and posterior THPs;  pt requires additional cues Other Exercises: pt/family educated strategies to support mild cognitive changes to maximize safety and independence   Shoulder Instructions      Home Living Family/patient expects to be discharged to:: Private residence Living Arrangements: Alone Available Help at Discharge: Personal care attendant;Available PRN/intermittently;Family Type of Home: Independent living facility Home Access: Level entry     Home Layout: One level     Bathroom Shower/Tub: Producer, television/film/video: Standard     Home Equipment: Shower seat;Grab bars - tub/shower;Walker - 4 wheels;Cane - single point          Prior Functioning/Environment Level of Independence: Needs assistance  Gait / Transfers Assistance Needed: Pt ambulates with rollator at all times and denies any falls in the past 6 months.   ADL's / Homemaking Assistance Needed: Pt independent with bathing, dressing, toileting.  Caregiver does the cleaning, runs the errands, reads the newspaper to the pt, and provides "reminders" for medication mgt.  Meals provided by ILF.    Comments: Pt's caregiver comes M-F 8:30am-2pm        OT Problem List: Decreased strength;Decreased knowledge of use of DME or AE;Decreased range of motion;Decreased knowledge of precautions;Decreased activity tolerance;Decreased cognition;Pain;Impaired balance (sitting and/or standing);Decreased safety awareness      OT Treatment/Interventions: Self-care/ADL training;Balance training;Therapeutic exercise;Therapeutic activities;DME and/or AE instruction;Patient/family education;Cognitive remediation/compensation    OT Goals(Current goals can be found in the care plan section) Acute Rehab OT Goals Patient Stated Goal: get better and go home OT Goal Formulation: With patient/family Time For Goal Achievement: 05/29/18 Potential to Achieve Goals: Good ADL Goals Pt Will Transfer to Toilet: with supervision;ambulating(BSC over toilet or  elevated, LRAD for amb, min VC for THPs) Additional ADL Goal #1: Pt/caregiver will don/doff compression stockings independently while maintaining posterior total hip precautions. Additional ADL Goal #2: Pt will verbalize 3/3 posterior total hip precautions with <20% verbal cues to maintain  during ADL and mobility to maximize safety.  OT Frequency: Min 2X/week   Barriers to D/C:            Co-evaluation              AM-PAC PT "6 Clicks" Daily Activity     Outcome Measure Help from another person eating meals?: A Little Help from another person taking care of personal grooming?: A Little Help from another person toileting, which includes using toliet, bedpan, or urinal?: A Little Help from another person bathing (including washing, rinsing, drying)?: A Lot Help from another person to put on and taking off regular upper body clothing?: None Help from another person to put on and taking off regular lower body clothing?: A Lot 6 Click Score: 17   End of Session Equipment Utilized During Treatment: Gait belt;Rolling walker  Activity Tolerance: Patient tolerated treatment well Patient left: in chair;with call bell/phone within reach;with chair alarm set;with family/visitor present  OT Visit Diagnosis: Other abnormalities of gait and mobility (R26.89);Other symptoms and signs involving cognitive function;Pain Pain - Right/Left: Right Pain - part of body: Hip                Time: 1021-1103 OT Time Calculation (min): 42 min Charges:  OT General Charges $OT Visit: 1 Visit OT Evaluation $OT Eval Moderate Complexity: 1 Mod OT Treatments $Self Care/Home Management : 23-37 mins   Richrd Prime, MPH, MS, OTR/L ascom [phone] 05/15/18, 1:44 PM

## 2018-05-15 NOTE — Progress Notes (Signed)
   Subjective: 1 Day Post-Op Procedure(s) (LRB): TOTAL HIP ARTHROPLASTY (Right) Patient reports pain as 0 on 0-10 scale.   Patient was noted to have confusion during the night.  Pulled out of her Hemovac.  This morning patient states that she is confused.  Not sure what room she is in.  Son was in the room and states that she has been somewhat confused recently. Patient did very well with therapy yesterday.  Able to get up and down out of a chair as well as walk 20 feet. Plan is to go Rehab after hospital stay. no nausea and no vomiting Patient denies any chest pains or shortness of breath. Objective: Vital signs in last 24 hours: Temp:  [97.4 F (36.3 C)-98.2 F (36.8 C)] 97.6 F (36.4 C) (06/25 0334) Pulse Rate:  [51-73] 52 (06/25 0334) Resp:  [12-19] 19 (06/25 0334) BP: (103-134)/(65-76) 134/65 (06/25 0334) SpO2:  [96 %-100 %] 99 % (06/25 0334) well approximated incision Heels are non tender and elevated off the bed using rolled towels Intake/Output from previous day: 06/24 0701 - 06/25 0700 In: 7778.3 [P.O.:360; I.V.:3091.7; IV Piggyback:4326.7] Out: 4180 [Urine:3330; Drains:350; Blood:500] Intake/Output this shift: No intake/output data recorded.  No results for input(s): HGB in the last 72 hours. No results for input(s): WBC, RBC, HCT, PLT in the last 72 hours. No results for input(s): NA, K, CL, CO2, BUN, CREATININE, GLUCOSE, CALCIUM in the last 72 hours. No results for input(s): LABPT, INR in the last 72 hours.  EXAM General - Patient is Alert, Appropriate and Confused Extremity - Neurologically intact Neurovascular intact Sensation intact distally Intact pulses distally Dorsiflexion/Plantar flexion intact No cellulitis present Compartment soft Dressing - dressing C/D/I Motor Function - intact, moving foot and toes well on exam.    Past Medical History:  Diagnosis Date  . Hyperlipidemia   . Macular degeneration     Assessment/Plan: 1 Day Post-Op  Procedure(s) (LRB): TOTAL HIP ARTHROPLASTY (Right) Active Problems:   Status post total replacement of hip  Estimated body mass index is 24.14 kg/m as calculated from the following:   Height as of this encounter: 5\' 2"  (1.575 m).   Weight as of this encounter: 59.9 kg (132 lb). Advance diet Up with therapy D/C IV fluids Plan for discharge tomorrow Discharge to SNF  Labs: None DVT Prophylaxis - Lovenox, Foot Pumps and TED hose Weight-Bearing as tolerated to right leg D/C O2 and Pulse OX and try on Room Air Begin working on bowel movement  Amrie Gurganus R. [facility] PA [facility] 05/15/2018, 7:40 AM

## 2018-05-15 NOTE — Progress Notes (Signed)
Physical Therapy Treatment Patient Details Name: Allison Madden MRN: [id number] DOB: 10/20/1929 Today's Date: 05/15/2018    History of Present Illness Pt is a 84 y/o F s/p R THA.  Pt's PMH includes macular degeneration.     PT Comments    Pt seen this morning and states that she is doing well after a restless night. Pt still appears confused and is only oriented to person and time. In confused state last night pt removed hemo-vac from operative site. Pt instructed in there-ex to increase LE strengthening which she tolerated well however fatigued easy. Pt continues to display deficits with strength, mobility and safety awareness. Pt demonstrates poor safety awareness due to her cognitive deficits. pt unable to recite posterior total hip precautions at beginning of session or at end of session after continued teaching throughout. Pt amb 20' using RW however requires verbal and tactile cuing for safety and to not break posterior total hip precautions. PT will continue to work with pt BID while admitted to improve deficits toward PLOF. D/c recommendation at this time continues to be SNF.   Follow Up Recommendations  SNF     Equipment Recommendations  Rolling walker with 5" wheels;3in1 (PT)    Recommendations for Other Services       Precautions / Restrictions Precautions Precautions: Fall;Posterior Hip Precaution Booklet Issued: Yes (comment) Precaution Comments: reviewed precautions 2x during session however pt displays little retention of posterior hip precautions. Unable to recall them at beginning of session or at end of session after education Restrictions Weight Bearing Restrictions: Yes RLE Weight Bearing: Weight bearing as tolerated    Mobility  Bed Mobility Overal bed mobility: Needs Assistance Bed Mobility: Supine to Sit     Supine to sit: Min assist     General bed mobility comments: cues for sequencing and to not break precautions. Assisted R LE to prevent pain  or adduction  Transfers Overall transfer level: Needs assistance Equipment used: Rolling walker (2 wheeled) Transfers: Sit to/from Stand Sit to Stand: Min guard         General transfer comment: bed slighly elevated to prevent breaking posterior total hip precautions pt performed sit to stand with CGA. Despite verbal and tactile cuing pt pulls self up using RW.  Ambulation/Gait Ambulation/Gait assistance: Min guard Gait Distance (Feet): 25 Feet Assistive device: Rolling walker (2 wheeled) Gait Pattern/deviations: Step-to pattern;Decreased stance time - right;Decreased step length - left;Decreased weight shift to right;Antalgic     General Gait Details: verbal cuing for safety with RW. Pt amb 20' with CGA. pt requires continuous verbal cuing to not break posterior hip precautions.    Stairs             Wheelchair Mobility    Modified Rankin (Stroke Patients Only)       Balance                                            Cognition Arousal/Alertness: Awake/alert Behavior During Therapy: WFL for tasks assessed/performed Overall Cognitive Status: History of cognitive impairments - at baseline Area of Impairment: Problem solving;Safety/judgement                         Safety/Judgement: Decreased awareness of deficits;Decreased awareness of safety   Problem Solving: Slow processing General Comments: pt demonstrates cognitive deficits noted through decreased safety/judgement and problem  solving. Pt oriented to person and time but not place stating that she thought she was at a bed and breakfast.      Exercises Other Exercises Other Exercises: pt instructed in ther-ex to R LE with verbal cuing and CGA for safety and sequencing x12 reps: SLR, hip abd, ankle pumps, quad sets, glut sets, SAQ.    General Comments        Pertinent Vitals/Pain Pain Assessment: 0-10 Pain Score: 2  Pain Location: R hip  Pain Descriptors / Indicators:  Aching;Operative site guarding Pain Intervention(s): Limited activity within patient's tolerance;Monitored during session;Premedicated before session;Repositioned    Home Living                      Prior Function            PT Goals (current goals can now be found in the care plan section) Acute Rehab PT Goals Patient Stated Goal: to get back to walking PT Goal Formulation: With patient Time For Goal Achievement: 05/28/18 Potential to Achieve Goals: Good Progress towards PT goals: Progressing toward goals    Frequency    BID      PT Plan Current plan remains appropriate    Co-evaluation              AM-PAC PT "6 Clicks" Daily Activity  Outcome Measure  Difficulty turning over in bed (including adjusting bedclothes, sheets and blankets)?: Unable Difficulty moving from lying on back to sitting on the side of the bed? : Unable Difficulty sitting down on and standing up from a chair with arms (e.g., wheelchair, bedside commode, etc,.)?: Unable Help needed moving to and from a bed to chair (including a wheelchair)?: A Little Help needed walking in hospital room?: A Little Help needed climbing 3-5 steps with a railing? : A Lot 6 Click Score: 11    End of Session Equipment Utilized During Treatment: Gait belt Activity Tolerance: Patient tolerated treatment well;Patient limited by fatigue;Patient limited by pain Patient left: in chair;with call bell/phone within reach;with chair alarm set;with family/visitor present;with SCD's reapplied;with nursing/sitter in room Nurse Communication: Mobility status;Precautions PT Visit Diagnosis: Pain;Difficulty in walking, not elsewhere classified (R26.2);Muscle weakness (generalized) (M62.81);Unsteadiness on feet (R26.81) Pain - Right/Left: Right Pain - part of body: Hip     Time: 2952-8413 PT Time Calculation (min) (ACUTE ONLY): 21 min  Charges:                       G Codes:       Joan Avetisyan,  SPT    Indica Marcott 05/15/2018, 11:21 AM

## 2018-05-15 NOTE — Anesthesia Postprocedure Evaluation (Signed)
Anesthesia Post Note  Patient: Allison Madden  Procedure(s) Performed: TOTAL HIP ARTHROPLASTY (Right Hip)  Patient location during evaluation: Nursing Unit Anesthesia Type: Spinal Level of consciousness: awake and alert and confused (Pt has a caregiver at home. Confused prior to surgery) Pain management: pain level controlled Vital Signs Assessment: post-procedure vital signs reviewed and stable Respiratory status: spontaneous breathing and nonlabored ventilation Cardiovascular status: stable Postop Assessment: no headache, no backache, no apparent nausea or vomiting, adequate PO intake and patient able to bend at knees Anesthetic complications: no     Last Vitals:  Vitals:   05/14/18 2340 05/15/18 0334  BP: 132/65 134/65  Pulse: (!) 52 (!) 52  Resp: 19 19  Temp: 36.6 C 36.4 C  SpO2: 96% 99%    Last Pain:  Vitals:   05/15/18 0334  TempSrc: Oral  PainSc:                  Zachary GeorgeWeatherly,  [name]

## 2018-05-15 NOTE — Progress Notes (Signed)
Pt has only received tylenol today for pain. Pt was pleasantly confused throughout the day today. Pt sat in the chair for a few hours. Her caregiver, Kathie RhodesBetty, will be staying the night with her tonight.   [place], Latricia HeftKorie G

## 2018-05-15 NOTE — Clinical Social Work Placement (Signed)
   CLINICAL SOCIAL WORK PLACEMENT  NOTE  Date:  05/15/2018  Patient Details  Name: Deidre Alalizabeth I Muramoto MRN: [id number] Date of Birth: 03/04/1929  Clinical Social Work is seeking post-discharge placement for this patient at the Skilled  Nursing Facility level of care (*CSW will initial, date and re-position this form in  chart as items are completed):  Yes   Patient/family provided with [facility] Clinical Social Work Department's list of facilities offering this level of care within the geographic area requested by the patient (or if unable, by the patient's family).  Yes   Patient/family informed of their freedom to choose among providers that offer the needed level of care, that participate in Medicare, Medicaid or managed care program needed by the patient, have an available bed and are willing to accept the patient.  Yes   Patient/family informed of [facility]'s ownership interest [facility] and [facility], as well as of the fact that they are under no obligation to receive care at these facilities.  PASRR submitted to EDS on 05/15/18     PASRR number received on 05/15/18     Existing PASRR number confirmed on       FL2 transmitted to all facilities in geographic area requested by pt/family on 05/15/18     FL2 transmitted to all facilities within larger geographic area on       Patient informed that his/her managed care company has contracts with or will negotiate with certain facilities, including the following:        Yes   Patient/family informed of bed offers received.  Patient chooses bed at [facility] )     Physician recommends and patient chooses bed at      Patient to be transferred to   on  .  Patient to be transferred to facility by       Patient family notified on   of transfer.  Name of family member notified:        PHYSICIAN       Additional Comment:    _______________________________________________ Tyrah Broers, Darleen CrockerBailey M,  LCSW 05/15/2018, 4:36 PM

## 2018-05-15 NOTE — Clinical Social Work Note (Signed)
Clinical Social Work Assessment  Patient Details  Name: Allison Madden MRN: [id number] Date of Birth: 1929-08-05  Date of referral:  05/15/18               Reason for consult:  Facility Placement                Permission sought to share information with:  Chartered certified accountant granted to share information::  Yes, Verbal Permission Granted  Name::      [place]::   [place]   Relationship::     Contact Information:     Housing/Transportation Living arrangements for the past 2 months:  [place] of Information:  Facility, Adult Children Patient Interpreter Needed:  None Criminal Activity/Legal Involvement Pertinent to Current Situation/Hospitalization:  No - Comment as needed Significant Relationships:  Adult Children Lives with:  Self Do you feel safe going back to the place where you live?  Yes Need for family participation in patient care:  Yes (Comment)  Care giving concerns:  [facility] lives [facility] [place] [facility] living alone.    Social Worker assessment / plan:  Holiday representative (CSW) received SNF consult. PT is recommending SNF. CSW met with patient alone at bedside to discuss D/C plan. Patient was oriented X1 to self only and was sitting up in the chair at bedside. Patient's daughter Ginger [phone] was at bedside. CSW introduced self and explained role of CSW department. Per daughter patient lives at independent living at [place] and plans on going to [facility] for rehab. CSW made patient and daughter aware that [facility] can accept patient when medically stable. Per [facility] admissions coordinator at [facility] authorization has been received. CSW will continue to follow and assist as needed.     Employment status:  Disabled (Comment on whether or not currently receiving Disability) Insurance information:  Managed Medicare PT Recommendations:  [place] / Referral to community resources:  [place]  Patient/Family's Response to care:  Patient and her daughter are agreeable for patient to D/C to [facility] [employer]   Patient/Family's Understanding of and Emotional Response to Diagnosis, Current Treatment, and Prognosis:  Patient and her daughter were very pleasant and thanked CSW for assistance.   Emotional Assessment Appearance:  Appears stated age Attitude/Demeanor/Rapport:    Affect (typically observed):  Pleasant Orientation:  Oriented to Self, Fluctuating Orientation (Suspected and/or reported Sundowners), Oriented to Place Alcohol / Substance use:  Not Applicable Psych involvement (Current and /or in the community):  No (Comment)  Discharge Needs  Concerns to be addressed:  Discharge Planning Concerns Readmission within the last 30 days:  No Current discharge risk:  Dependent with Mobility Barriers to Discharge:  Continued Medical Work up   [employer], Veronia Beets, LCSW 05/15/2018, 4:37 PM

## 2018-05-15 NOTE — Progress Notes (Signed)
Physical Therapy Treatment Patient Details Name: Allison Madden MRN: [id number] DOB: 1929/02/22 Today's Date: 05/15/2018    History of Present Illness Pt is a 84 y/o F s/p R THA.  Pt's PMH includes macular degeneration.     PT Comments    Pt in recliner, fatigued and ready to go back to bed.  Stood with min guard and was able to ambulate 20' in room with walker and min assist.  Upon return to bed she remained standing for exercises as below.  PT tolerated standing exercise well without LOB.  Transferred to commode in attempt to void but no results.    Returned to bed with min/mod a x 1 for LE management and to reposition for comfort.   Follow Up Recommendations  SNF     Equipment Recommendations  Rolling walker with 5" wheels;3in1 (PT)    Recommendations for Other Services       Precautions / Restrictions Precautions Precautions: Fall;Posterior Hip Precaution Booklet Issued: Yes (comment) Precaution Comments: poor immediate and delayed recall of precautions t/o session despite cues/education  Restrictions Weight Bearing Restrictions: Yes RLE Weight Bearing: Weight bearing as tolerated    Mobility  Bed Mobility Overal bed mobility: Needs Assistance Bed Mobility: Sit to Supine     Supine to sit: Min assist Sit to supine: Min assist   General bed mobility comments: deferred, up in recliner  Transfers Overall transfer level: Needs assistance Equipment used: Rolling walker (2 wheeled) Transfers: Sit to/from Stand Sit to Stand: Min guard         General transfer comment: educated in hand/foot positioning to maximize safety and maintain posterior THPs (caregiver present and verbalized understanding as well)  Ambulation/Gait Ambulation/Gait assistance: Min guard Gait Distance (Feet): 20 Feet Assistive device: Rolling walker (2 wheeled) Gait Pattern/deviations: Step-to pattern Gait velocity: decreased   General Gait Details: verbal cuing for safety with RW. Pt  amb 20' with CGA. pt requires continuous verbal cuing to not break posterior hip precautions.    Stairs             Wheelchair Mobility    Modified Rankin (Stroke Patients Only)       Balance Overall balance assessment: Needs assistance Sitting-balance support: No upper extremity supported;Feet supported Sitting balance-Leahy Scale: Fair     Standing balance support: Bilateral upper extremity supported;During functional activity Standing balance-Leahy Scale: Poor Standing balance comment: Pt relies on UE support for static and dynamic activities                            Cognition Arousal/Alertness: Awake/alert Behavior During Therapy: WFL for tasks assessed/performed Overall Cognitive Status: Impaired/Different from baseline Area of Impairment: Problem solving;Safety/judgement;Memory;Following commands                     Memory: Decreased recall of precautions Following Commands: Follows one step commands consistently;Follows one step commands with increased time;Follows multi-step commands inconsistently Safety/Judgement: Decreased awareness of deficits;Decreased awareness of safety   Problem Solving: Slow processing;Requires verbal cues;Requires tactile cues General Comments: family note pt is mildly more confused following surgery than she normally is      Exercises Total Joint Exercises Ankle Circles/Pumps: AROM;Both;10 reps;Supine Quad Sets: Strengthening;Both;10 reps;Supine Gluteal Sets: Strengthening;10 reps;Supine;Right Heel Slides: 10 reps;AAROM;Strengthening;Supine;Right Straight Leg Raises: 10 reps;Strengthening;Right;Standing;AROM Long Arc Quad: 10 reps;AAROM;Strengthening;Right;Seated Marching in Standing: AROM;Strengthening;Right;Standing;10 reps Other Exercises Other Exercises: to commode to attempt voiding but no results. Other Exercises: pt/caregiver/family instructed in  compression stocking mgt with verbal instruction and  visual demo - verbalized understanding Other Exercises: pt/caregiver/family educated in falls prevention and posterior THPs; pt requires additional cues Other Exercises: pt/family educated strategies to support mild cognitive changes to maximize safety and independence    General Comments        Pertinent Vitals/Pain Pain Assessment: Faces Pain Score: 2  Faces Pain Scale: Hurts a little bit Pain Location: R hip  Pain Descriptors / Indicators: Sore Pain Intervention(s): Limited activity within patient's tolerance;Monitored during session    Home Living Family/patient expects to be discharged to:: Private residence Living Arrangements: Alone Available Help at Discharge: Personal care attendant;Available PRN/intermittently;Family Type of Home: Independent living facility Home Access: Level entry   Home Layout: One level Home Equipment: Shower seat;Grab bars - tub/shower;Walker - 4 wheels;Cane - single point      Prior Function Level of Independence: Needs assistance  Gait / Transfers Assistance Needed: Pt ambulates with rollator at all times and denies any falls in the past 6 months.   ADL's / Homemaking Assistance Needed: Pt independent with bathing, dressing, toileting.  Caregiver does the cleaning, runs the errands, reads the newspaper to the pt, and provides "reminders" for medication mgt.  Meals provided by ILF.  Comments: Pt's caregiver comes M-F 8:30am-2pm   PT Goals (current goals can now be found in the care plan section) Acute Rehab PT Goals Patient Stated Goal: get better and go home PT Goal Formulation: With patient Time For Goal Achievement: 05/28/18 Potential to Achieve Goals: Good Progress towards PT goals: Progressing toward goals    Frequency    BID      PT Plan Current plan remains appropriate    Co-evaluation              AM-PAC PT "6 Clicks" Daily Activity  Outcome Measure  Difficulty turning over in bed (including adjusting bedclothes,  sheets and blankets)?: Unable Difficulty moving from lying on back to sitting on the side of the bed? : Unable Difficulty sitting down on and standing up from a chair with arms (e.g., wheelchair, bedside commode, etc,.)?: Unable Help needed moving to and from a bed to chair (including a wheelchair)?: A Little Help needed walking in hospital room?: A Little Help needed climbing 3-5 steps with a railing? : A Lot 6 Click Score: 11    End of Session Equipment Utilized During Treatment: Gait belt Activity Tolerance: Patient tolerated treatment well;Patient limited by fatigue Patient left: in bed;with bed alarm set;with call bell/phone within reach;with family/visitor present Nurse Communication: Mobility status;Precautions PT Visit Diagnosis: Pain;Difficulty in walking, not elsewhere classified (R26.2);Muscle weakness (generalized) (M62.81);Unsteadiness on feet (R26.81) Pain - Right/Left: Right Pain - part of body: Hip     Time: [id number] PT Time Calculation (min) (ACUTE ONLY): 23 min  Charges:  $Gait Training: 8-22 mins $Therapeutic Exercise: 8-22 mins                    G Codes:       Danielle DessSarah Benyamin Jeff, PTA 05/15/18, 3:07 PM

## 2018-05-15 NOTE — [place] FL2 (Signed)
MEDICAID FL2 LEVEL OF CARE SCREENING TOOL     IDENTIFICATION  Patient Name: Allison Madden Birthdate: [birth year] Sex: female Admission Date (Current Location): 05/14/2018  [place] and [place] Number:  Chiropodist and Address:  [facility], [address], [place], [place] [address]      Provider Number: [id number]  Attending Physician Name and Address:  Donato Heinz, MD  Relative Name and Phone Number:       Current Level of Care: Hospital Recommended Level of Care: Skilled Nursing Facility Prior Approval Number:    Date Approved/Denied:   PASRR Number: [id number])  Discharge Plan: SNF    Current Diagnoses: Patient Active Problem List   Diagnosis Date Noted  . Scoliosis 05/14/2018  . Status post total replacement of hip 05/14/2018  . Primary osteoarthritis of right hip 03/04/2018  . Low serum vitamin D 08/03/2017  . Hyperlipidemia, mixed 07/03/2017  . Recurrent UTI 07/16/2014    Orientation RESPIRATION BLADDER Height & Weight     Self  Normal Continent Weight: 132 lb (59.9 kg) Height:  5\' 2"  (157.5 cm)  BEHAVIORAL SYMPTOMS/MOOD NEUROLOGICAL BOWEL NUTRITION STATUS      Continent Diet(Diet: Regular )  AMBULATORY STATUS COMMUNICATION OF NEEDS Skin   Extensive Assist Verbally Surgical wounds(Incision: Right Hip. )                       Personal Care Assistance Level of Assistance  Bathing, Feeding, Dressing Bathing Assistance: Limited assistance Feeding assistance: Independent Dressing Assistance: Limited assistance     Functional Limitations Info  Sight, Hearing, Speech Sight Info: Adequate Hearing Info: Adequate Speech Info: Adequate    SPECIAL CARE FACTORS FREQUENCY  PT (By licensed PT), OT (By licensed OT)     PT Frequency: (5) OT Frequency: (5)            Contractures      Additional Factors Info  Code Status, Allergies Code Status Info: (Full Code. ) Allergies Info:  (Tetracycline)           Current Medications (05/15/2018):  This is the current hospital active medication list Current Facility-Administered Medications  Medication Dose Route Frequency Provider Last Rate Last Dose  . 0.9 %  sodium chloride infusion   Intravenous Continuous Hooten, Illene Labrador, MD 100 mL/hr at 05/15/18 0857    . acetaminophen (OFIRMEV) IV 1,000 mg  1,000 mg Intravenous Q6H Hooten, Illene Labrador, MD 400 mL/hr at 05/15/18 0857 1,000 mg at 05/15/18 0857  . acetaminophen (TYLENOL) tablet 325-650 mg  325-650 mg Oral Q6H PRN Hooten, Illene Labrador, MD      . alum & mag hydroxide-simeth (MAALOX/MYLANTA) 200-200-20 MG/5ML suspension 30 mL  30 mL Oral Q4H PRN Hooten, Illene Labrador, MD      . bisacodyl (DULCOLAX) suppository 10 mg  10 mg Rectal Daily PRN Hooten, Illene Labrador, MD      . ceFAZolin (ANCEF) IVPB 2g/100 mL premix  2 g Intravenous Q6H Hooten, Illene Labrador, MD   Stopped at 05/15/18 0400  . celecoxib (CELEBREX) capsule 200 mg  200 mg Oral BID Donato Heinz, MD   200 mg at 05/15/18 0904  . cholecalciferol (VITAMIN D) tablet 5,000 Units  5,000 Units Oral Daily Donato Heinz, MD   5,000 Units at 05/15/18 0906  . clobetasol ointment (TEMOVATE) 0.05 % 1 application  1 application Topical BID Hooten, Illene Labrador, MD   1 application at 05/14/18 2024  . diphenhydrAMINE (BENADRYL)  12.5 MG/5ML elixir 12.5-25 mg  12.5-25 mg Oral Q4H PRN Hooten, Illene LabradorJames P, MD      . enoxaparin (LOVENOX) injection 30 mg  30 mg Subcutaneous Q12H Hooten, Illene LabradorJames P, MD   30 mg at 05/15/18 0910  . ferrous sulfate tablet 325 mg  325 mg Oral BID WC Donato HeinzHooten, James P, MD   325 mg at 05/15/18 0904  . gabapentin (NEURONTIN) capsule 300 mg  300 mg Oral QHS Hooten, Illene LabradorJames P, MD   300 mg at 05/14/18 2024  . HYDROmorphone (DILAUDID) injection 0.5-1 mg  0.5-1 mg Intravenous Q4H PRN Hooten, Illene LabradorJames P, MD      . magnesium hydroxide (MILK OF MAGNESIA) suspension 30 mL  30 mL Oral Daily Hooten, Illene LabradorJames P, MD   30 mL at 05/15/18 0913  . menthol-cetylpyridinium  (CEPACOL) lozenge 3 mg  1 lozenge Oral PRN Hooten, Illene LabradorJames P, MD       Or  . phenol (CHLORASEPTIC) mouth spray 1 spray  1 spray Mouth/Throat PRN Hooten, Illene LabradorJames P, MD      . metoCLOPramide (REGLAN) tablet 5-10 mg  5-10 mg Oral Q8H PRN Hooten, Illene LabradorJames P, MD       Or  . metoCLOPramide (REGLAN) injection 5-10 mg  5-10 mg Intravenous Q8H PRN Hooten, Illene LabradorJames P, MD      . metoCLOPramide (REGLAN) tablet 10 mg  10 mg Oral TID AC & HS Hooten, Illene LabradorJames P, MD   10 mg at 05/15/18 0859  . multivitamin with minerals tablet 1 tablet  1 tablet Oral Daily Hooten, Illene LabradorJames P, MD   1 tablet at 05/15/18 0906  . nitrofurantoin (MACRODANTIN) capsule 100 mg  100 mg Oral Daily Hooten, Illene LabradorJames P, MD      . ondansetron (ZOFRAN) tablet 4 mg  4 mg Oral Q6H PRN Hooten, Illene LabradorJames P, MD       Or  . ondansetron (ZOFRAN) injection 4 mg  4 mg Intravenous Q6H PRN Hooten, Illene LabradorJames P, MD      . oxyCODONE (Oxy IR/ROXICODONE) immediate release tablet 10 mg  10 mg Oral Q4H PRN Hooten, Illene LabradorJames P, MD      . oxyCODONE (Oxy IR/ROXICODONE) immediate release tablet 5 mg  5 mg Oral Q4H PRN Hooten, Illene LabradorJames P, MD      . pantoprazole (PROTONIX) EC tablet 40 mg  40 mg Oral BID Donato HeinzHooten, James P, MD   40 mg at 05/15/18 0859  . pravastatin (PRAVACHOL) tablet 40 mg  40 mg Oral QHS Hooten, Illene LabradorJames P, MD   40 mg at 05/14/18 2024  . senna-docusate (Senokot-S) tablet 1 tablet  1 tablet Oral BID Donato HeinzHooten, James P, MD   1 tablet at 05/15/18 0910  . sodium phosphate (FLEET) 7-19 GM/118ML enema 1 enema  1 enema Rectal Once PRN Hooten, Illene LabradorJames P, MD      . traMADol Janean Sark(ULTRAM) tablet 50-100 mg  50-100 mg Oral Q4H PRN Hooten, Illene LabradorJames P, MD   50 mg at 05/14/18 1325     Discharge Medications: Please see discharge summary for a list of discharge medications.  Relevant Imaging Results:  Relevant Lab Results:   Additional Information (SSN: [id number])  Leomar Westberg, Darleen CrockerBailey M, LCSW

## 2018-05-15 NOTE — Discharge Summary (Addendum)
Physician Discharge Summary  Patient ID: Allison Madden MRN: [id number] DOB/AGE: 1929-04-30 84 y.o.  Admit date: 05/14/2018 Discharge date: 05/16/2018  Admission Diagnoses:  primary osteoarthritis of right hip   Discharge Diagnoses: Patient Active Problem List   Diagnosis Date Noted  . Scoliosis 05/14/2018  . Status post total replacement of hip 05/14/2018  . Primary osteoarthritis of right hip 03/04/2018  . Low serum vitamin D 08/03/2017  . Hyperlipidemia, mixed 07/03/2017  . Recurrent UTI 07/16/2014    Past Medical History:  Diagnosis Date  . Hyperlipidemia   . Macular degeneration      Transfusion: No transfusions during this admission   Consultants (if any):   Discharged Condition: Improved  Hospital Course: Allison Madden is an 84 y.o. female who was admitted 05/14/2018 with a diagnosis of degenerative arthrosis right hip and went to the operating room on 05/14/2018 and underwent the above named procedures.  Patient was noted to be confused today 1 and continued to be confused throughout the hospital course.  Son states that she was noted to have some confusion prior to surgery.  Appears that the anesthesia may have increased this confusion to some degree.  Patient is very pleasant and cooperative.   Surgeries:Procedure(s): TOTAL HIP ARTHROPLASTY on 05/14/2018  PRE-OPERATIVE DIAGNOSIS: Degenerative arthrosis of the right hip, primary  POST-OPERATIVE DIAGNOSIS:  Same  PROCEDURE:  Right total hip arthroplasty  SURGEON:  Jena Gauss. M.D.  ASSISTANT:  Van Clines, PA (present and scrubbed throughout the case, critical for assistance with exposure, retraction, instrumentation, and closure)  ANESTHESIA: spinal  ESTIMATED BLOOD LOSS: 300 mL  FLUIDS REPLACED: 1200 mL of crystalloid  DRAINS: 2 medium drains to a Hemovac reservoir  IMPLANTS UTILIZED: DePuy 12 mm small stature AML femoral stem, 46 mm OD Pinnacle 100 Gription acetabular  component, neutral Pinnacle Altrx polyethylene insert, and a 28 mm CoCr +1.5 mm hip ball  INDICATIONS FOR SURGERY: Allison Madden is a 84 y.o. year old female with a long history of progressive hip and groin  pain. X-rays demonstrated severe degenerative changes. The patient had not seen any significant improvement despite conservative nonsurgical intervention. After discussion of the risks and benefits of surgical intervention, the patient expressed understanding of the risks benefits and agree with plans for total hip arthroplasty.   The risks, benefits, and alternatives were discussed at length including but not limited to the risks of infection, bleeding, nerve injury, stiffness, blood clots, the need for revision surgery, limb length inequality, dislocation, cardiopulmonary complications, among others, and they were willing to proceed.   Patient tolerated the surgery well. No complications .Patient was taken to PACU where she was stabilized and then transferred to the orthopedic floor.  Patient started on Lovenox 30 mg q 12 hrs. Foot pumps applied bilaterally at 80 mm hgb. Heels elevated off bed with rolled towels. No evidence of DVT. Calves non tender. Negative [place]. Physical therapy started on day #1 for gait training and transfer with OT starting on  day #1 for ADL and assisted devices. Patient has done well with therapy. Ambulated 20 feet upon being discharged.  Patient's IV And Foley were discontinued on day #1 with Hemovac being discontinued on day #2. Dressing was changed on day 2 prior to patient being discharged   She was given perioperative antibiotics:  Anti-infectives (From admission, onward)   Start     Dose/Rate Route Frequency Ordered Stop   05/14/18 1200  ceFAZolin (ANCEF) IVPB 2g/100 mL premix  2 g 200 mL/hr over 30 Minutes Intravenous Every 6 hours 05/14/18 1156 05/15/18 1429   05/14/18 0600  ceFAZolin (ANCEF) IVPB 2g/100 mL premix     2 g 200 mL/hr over 30  Minutes Intravenous On call to O.R. 05/13/18 2201 05/14/18 0800   05/14/18 0556  ceFAZolin (ANCEF) 2-4 GM/100ML-% IVPB    Note to Pharmacy:  Francene Finders   : cabinet override      05/14/18 0556 05/14/18 0748    .  She was fitted with AV 1 compression foot pump devices, instructed on heel pumps, early ambulation, and fitted with TED stockings bilaterally for DVT prophylaxis.  She benefited maximally from the hospital stay and there were no complications.    Recent vital signs:  Vitals:   05/14/18 2340 05/15/18 0334  BP: 132/65 134/65  Pulse: (!) 52 (!) 52  Resp: 19 19  Temp: 97.8 F (36.6 C) 97.6 F (36.4 C)  SpO2: 96% 99%    Recent laboratory studies:  Lab Results  Component Value Date   HGB 13.9 05/02/2018   Lab Results  Component Value Date   WBC 8.1 05/02/2018   PLT 203 05/02/2018   Lab Results  Component Value Date   INR 0.94 05/02/2018   Lab Results  Component Value Date   NA 141 05/02/2018   K 3.5 05/02/2018   CL 105 05/02/2018   CO2 26 05/02/2018   BUN 16 05/02/2018   CREATININE 0.69 05/02/2018   GLUCOSE 99 05/02/2018   Hold oxycodone and tramadol. May give tylenol  Discharge Medications:   Allergies as of 05/15/2018      Reactions   Tetracycline Nausea Only   Confusion      Medication List    STOP taking these medications   ibuprofen 200 MG tablet Commonly known as:  ADVIL,MOTRIN     TAKE these medications   clobetasol ointment 0.05 % Commonly known as:  TEMOVATE Apply 1 application topically 2 (two) times daily.   enoxaparin 30 MG/0.3ML injection Commonly known as:  LOVENOX Inject 0.3 mLs (30 mg total) into the skin every 12 (twelve) hours for 14 days. Start taking on:  05/17/2018   multivitamin with minerals Tabs tablet Take 1 tablet by mouth daily. Centrum Silver   nitrofurantoin 100 MG capsule Commonly known as:  MACRODANTIN Take 100 mg by mouth daily.   oxyCODONE 5 MG immediate release tablet Commonly known as:  Oxy  IR/ROXICODONE Take 1 tablet (5 mg total) by mouth every 4 (four) hours as needed for moderate pain (pain score 4-6).   pravastatin 40 MG tablet Commonly known as:  PRAVACHOL Take 40 mg by mouth at bedtime.   traMADol 50 MG tablet Commonly known as:  ULTRAM Take 1-2 tablets (50-100 mg total) by mouth every 4 (four) hours as needed for moderate pain.   VITAMIN D2 PO Take 5,000 Units by mouth daily.            Durable Medical Equipment  (From admission, onward)        Start     Ordered   05/14/18 1157  DME Walker rolling  Once    Question:  Patient needs a walker to treat with the following condition  Answer:  S/P total hip arthroplasty   05/14/18 1156   05/14/18 1157  DME Bedside commode  Once    Question:  Patient needs a bedside commode to treat with the following condition  Answer:  S/P total hip arthroplasty   05/14/18 1156  Wound care Staples are to be removed May 28, 2018 followed by the application of benzoin half-inch Steri-Strips Change dressing as needed [facility]  Appointment should be already made Hip precautions  Diagnostic Studies: Dg Hip Port Unilat With Pelvis 1v Right  Result Date: 05/14/2018 CLINICAL DATA:  Total right hip replacement EXAM: DG HIP (WITH OR WITHOUT PELVIS) 1V PORT RIGHT COMPARISON:  None. FINDINGS: The patient is status post total right hip replacement. Hardware is in good position. Surgical drains are noted. IMPRESSION: Total right hip replacement as above. Electronically Signed   By: Gerome Samavid  Williams III M.D   On: 05/14/2018 11:10   Hold all pain medicine except for Tylenol.  Disposition:   Discharge Instructions    Increase activity slowly   Complete by:  As directed       Follow-up Information    Hooten, Illene LabradorJames P, MD On 06/26/2018.   Specialty:  Orthopedic Surgery Why:  at 2:00pm Contact information: [address] [facility] [place] [id number] [phone]             Signed: Tera PartridgeWOLFE,JON R. 05/15/2018, 8:01 AM

## 2018-05-15 NOTE — Progress Notes (Signed)
During patient rounding, Hemovac found in floor. Pt stated "she didn't know what she was doing. Hemovac removed per patient. Site assessed. Dressing applied. Will notify MD.

## 2018-05-16 ENCOUNTER — Other Ambulatory Visit: Payer: Self-pay

## 2018-05-16 LAB — SURGICAL PATHOLOGY

## 2018-05-16 MED ORDER — ACETAMINOPHEN 500 MG PO TABS: 500.0000 mg | ORAL_TABLET | Freq: Four times a day (QID) | ORAL | 0 refills | Status: DC | PRN

## 2018-05-16 MED ORDER — OXYCODONE HCL 5 MG PO TABS: 5.0000 mg | ORAL_TABLET | ORAL | 0 refills | Status: DC | PRN

## 2018-05-16 NOTE — Clinical Social Work Placement (Signed)
   CLINICAL SOCIAL WORK PLACEMENT  NOTE  Date:  05/16/2018  Patient Details  Name: Allison Madden MRN: [id number] Date of Birth: 12/14/1928  Clinical Social Work is seeking post-discharge placement for this patient at the Skilled  Nursing Facility level of care (*CSW will initial, date and re-position this form in  chart as items are completed):  Yes   Patient/family provided with [facility] Clinical Social Work Department's list of facilities offering this level of care within the geographic area requested by the patient (or if unable, by the patient's family).  Yes   Patient/family informed of their freedom to choose among providers that offer the needed level of care, that participate in Medicare, Medicaid or managed care program needed by the patient, have an available bed and are willing to accept the patient.  Yes   Patient/family informed of [facility]'s ownership interest in [facility] and [facility], as well as of the fact that they are under no obligation to receive care at these facilities.  PASRR submitted to EDS on 05/15/18     PASRR number received on 05/15/18     Existing PASRR number confirmed on       FL2 transmitted to all facilities in geographic area requested by pt/family on 05/15/18     FL2 transmitted to all facilities within larger geographic area on       Patient informed that his/her managed care company has contracts with or will negotiate with certain facilities, including the following:        Yes   Patient/family informed of bed offers received.  Patient chooses bed at [facility] )     Physician recommends and patient chooses bed at      Patient to be transferred to [facility] ) on 05/16/18.  Patient to be transferred to facility by [facility] )     Patient family notified on 05/16/18 of transfer.  Name of family member notified:  (Patient's son Cleone SlimRob is at bedside and aware of D/C today. )     PHYSICIAN    Additional Comment:    _______________________________________________ Tigerlily Christine, Darleen CrockerBailey M, LCSW 05/16/2018, 9:21 AM

## 2018-05-16 NOTE — Progress Notes (Signed)
Patient is medically stable for D/C to [facility] today. [facility] authorization has been received and patient can come today to room 354. RN will call report at [phone] and arrange EMS for transport. Clinical Child psychotherapistocial Worker (CSW) sent [facility]/C orders [facility] via [employer]. Patient is aware of above. Patient's son Cleone SlimRob is at bedside and aware of above. Please reconsult if future social work needs arise. CSW signing off.   Baker Hughes IncorporatedBailey Costa Jha, LCSW [phone]

## 2018-05-16 NOTE — Telephone Encounter (Signed)
Rx sent to [facility] phone : [phone] , fax : [phone]  

## 2018-05-16 NOTE — Progress Notes (Signed)
   Subjective: 2 Days Post-Op Procedure(s) (LRB): TOTAL HIP ARTHROPLASTY (Right) Patient reports pain as 2 on 0-10 scale.   Patient continues to be pleasantly confused.  Does not know date, time or place. Sitter with patient during the night.  States that the patient slept all night and had no issues. Patient did well with physical therapy yesterday.  Was able to ambulate 20 feet. Plan is to go Rehab after hospital stay. no nausea and no vomiting Patient denies any chest pains or shortness of breath. Objective: Vital signs in last 24 hours: Temp:  [97.6 F (36.4 C)-97.9 F (36.6 C)] 97.9 F (36.6 C) (06/26 0104) Pulse Rate:  [51-62] 61 (06/26 0104) Resp:  [17-18] 17 (06/26 0104) BP: (116-144)/(56-63) 116/60 (06/26 0104) SpO2:  [97 %-99 %] 99 % (06/26 0104) well approximated incision Heels are non tender and elevated off the bed using rolled towels Intake/Output from previous day: 06/25 0701 - 06/26 0700 In: 1350 [I.V.:1250; IV Piggyback:100] Out: 150 [Urine:150] Intake/Output this shift: No intake/output data recorded.  No results for input(s): HGB in the last 72 hours. No results for input(s): WBC, RBC, HCT, PLT in the last 72 hours. No results for input(s): NA, K, CL, CO2, BUN, CREATININE, GLUCOSE, CALCIUM in the last 72 hours. No results for input(s): LABPT, INR in the last 72 hours.  EXAM General - Patient is Alert, Appropriate and Oriented Extremity - Neurologically intact Neurovascular intact Sensation intact distally Intact pulses distally Dorsiflexion/Plantar flexion intact No cellulitis present Compartment soft Dressing - dressing C/D/I Motor Function - intact, moving foot and toes well on exam.    Past Medical History:  Diagnosis Date  . Hyperlipidemia   . Macular degeneration     Assessment/Plan: 2 Days Post-Op Procedure(s) (LRB): TOTAL HIP ARTHROPLASTY (Right) Active Problems:   Status post total replacement of hip  Estimated body mass index is  24.14 kg/m as calculated from the following:   Height as of this encounter: 5\' 2"  (1.575 m).   Weight as of this encounter: 59.9 kg (132 lb). Up with therapy Discharge to SNF  Labs: None DVT Prophylaxis - Lovenox, Foot Pumps and TED hose Weight-Bearing as tolerated to right leg Please change dressing to right hip prior to patient being discharged Continue to hold narcotics and give Tylenol for discomfort  Talya Quain R. [facility] PA [facility] 05/16/2018, 7:01 AM

## 2018-05-16 NOTE — Progress Notes (Signed)
Pt ready for d/c to SNF today per MD. Pt still pleasantly confused. Report called to [facility] at [facility], all questions answered. Pt's dressing to right hip changed per order, staples intact. PIV removed, VSS. EMS transportation set up for pt. Pt's son, Cleone SlimRob, at bedside.   [place], Latricia HeftKorie G

## 2018-05-17 ENCOUNTER — Other Ambulatory Visit: Payer: Self-pay

## 2018-05-17 ENCOUNTER — Other Ambulatory Visit
Admission: RE | Admit: 2018-05-17 | Discharge: 2018-05-17 | Disposition: A | Discharge status: Home / Self Care | Payer: Medicare Other | Source: Other Acute Inpatient Hospital | Attending: Gerontology | Admitting: Gerontology

## 2018-05-17 DIAGNOSIS — D649 Anemia, unspecified: Secondary | ICD-10-CM | POA: Insufficient documentation

## 2018-05-17 LAB — COMPREHENSIVE METABOLIC PANEL
ALT: 10 U/L (ref 0–44)
ANION GAP: 8 (ref 5–15)
AST: 33 U/L (ref 15–41)
Albumin: 3.2 g/dL — ABNORMAL LOW (ref 3.5–5.0)
Alkaline Phosphatase: 84 U/L (ref 38–126)
BUN: 9 mg/dL (ref 8–23)
CO2: 25 mmol/L (ref 22–32)
CREATININE: 0.52 mg/dL (ref 0.44–1.00)
Calcium: 8.2 mg/dL — ABNORMAL LOW (ref 8.9–10.3)
Chloride: 106 mmol/L (ref 98–111)
Glucose, Bld: 112 mg/dL — ABNORMAL HIGH (ref 70–99)
POTASSIUM: 3.5 mmol/L (ref 3.5–5.1)
Sodium: 139 mmol/L (ref 135–145)
Total Bilirubin: 1 mg/dL (ref 0.3–1.2)
Total Protein: 5.9 g/dL — ABNORMAL LOW (ref 6.5–8.1)

## 2018-05-17 LAB — CBC WITH DIFFERENTIAL/PLATELET
BASOS ABS: 0 10*3/uL (ref 0–0.1)
BASOS PCT: 0 %
Eosinophils Absolute: 0.2 10*3/uL (ref 0–0.7)
Eosinophils Relative: 2 %
HEMATOCRIT: 32.5 % — AB (ref 35.0–47.0)
HEMOGLOBIN: 11.1 g/dL — AB (ref 12.0–16.0)
Lymphocytes Relative: 18 %
Lymphs Abs: 1.8 10*3/uL (ref 1.0–3.6)
MCH: 32.1 pg (ref 26.0–34.0)
MCHC: 34 g/dL (ref 32.0–36.0)
MCV: 94.5 fL (ref 80.0–100.0)
MONOS PCT: 11 %
Monocytes Absolute: 1.1 10*3/uL — ABNORMAL HIGH (ref 0.2–0.9)
NEUTROS ABS: 6.9 10*3/uL — AB (ref 1.4–6.5)
NEUTROS PCT: 69 %
Platelets: 179 10*3/uL (ref 150–440)
RBC: 3.45 MIL/uL — ABNORMAL LOW (ref 3.80–5.20)
RDW: 13.7 % (ref 11.5–14.5)
WBC: 10.1 10*3/uL (ref 3.6–11.0)

## 2018-05-17 MED ORDER — TRAMADOL HCL 50 MG PO TABS: 50.0000 mg | ORAL_TABLET | ORAL | 0 refills | Status: DC | PRN

## 2018-05-17 NOTE — Telephone Encounter (Signed)
Rx sent to [facility] phone : [phone] , fax : [phone]  

## 2018-05-21 ENCOUNTER — Encounter
Admission: RE | Admit: 2018-05-21 | Discharge: 2018-05-21 | Disposition: A | Discharge status: Home / Self Care | Payer: Medicare Other | Source: Ambulatory Visit | Attending: Internal Medicine | Admitting: Internal Medicine

## 2018-05-23 ENCOUNTER — Non-Acute Institutional Stay (SKILLED_NURSING_FACILITY): Payer: Medicare Other | Admitting: Adult Health

## 2018-05-23 ENCOUNTER — Encounter: Payer: Self-pay | Admitting: Adult Health

## 2018-05-23 DIAGNOSIS — M1611 Unilateral primary osteoarthritis, right hip: Secondary | ICD-10-CM

## 2018-05-23 DIAGNOSIS — Z96641 Presence of right artificial hip joint: Secondary | ICD-10-CM | POA: Diagnosis not present

## 2018-05-23 NOTE — Progress Notes (Signed)
Location:   [facility] Room Number: 354 P Place of Service:  SNF (31)    CODE STATUS: Full Code  Allergies  Allergen Reactions  . Tetracycline Nausea Only    Confusion    Chief Complaint  Patient presents with  . Discharge Note    Discharging on 05/26/18    HPI:  She is being discharged to home with home health for pt/ot. She will need a standard wheelchair. She will need her prescription medications written and will need to follow up with her medical provider. Her daughter will make a follow up appointment. She denies any uncontrolled pain; no constipation; no change in appetite.  She had been hospitalized for a right hip replacement and was admitted to this facility for short term rehab.    Past Medical History:  Diagnosis Date  . Hyperlipidemia   . Macular degeneration     Past Surgical History:  Procedure Laterality Date  . EYE SURGERY    . TONSILLECTOMY    . TOTAL HIP ARTHROPLASTY Right 05/14/2018   Procedure: TOTAL HIP ARTHROPLASTY;  Surgeon: Donato Heinz, MD;  Location: [facility] ORS;  Service: Orthopedics;  Laterality: Right;    Social History   Socioeconomic History  . Marital status: Widowed    Spouse name: Not on file  . Number of children: Not on file  . Years of education: Not on file  . Highest education level: Not on file  Occupational History  . Not on file  Social Needs  . Financial resource strain: Not on file  . Food insecurity:    Worry: Not on file    Inability: Not on file  . Transportation needs:    Medical: Not on file    Non-medical: Not on file  Tobacco Use  . Smoking status: Former Smoker    Packs/day: 0.50    Years: 20.00    Pack years: 10.00    Types: Cigarettes    Last attempt to quit: 05/03/1971    Years since quitting: 47.0  . Smokeless tobacco: Never Used  . Tobacco comment: quit 47 years ago  Substance and Sexual Activity  . Alcohol use: Yes    Alcohol/week: 4.2 oz    Types: 7 Glasses of  wine per week  . Drug use: Never  . Sexual activity: Not on file  Lifestyle  . Physical activity:    Days per week: Not on file    Minutes per session: Not on file  . Stress: Not on file  Relationships  . Social connections:    Talks on phone: Not on file    Gets together: Not on file    Attends religious service: Not on file    Active member of club or organization: Not on file    Attends meetings of clubs or organizations: Not on file    Relationship status: Not on file  . Intimate partner violence:    Fear of current or ex partner: Not on file    Emotionally abused: Not on file    Physically abused: Not on file    Forced sexual activity: Not on file  Other Topics Concern  . Not on file  Social History Narrative  . Not on file   History reviewed. No pertinent family history.  VITAL SIGNS BP 109/72   Pulse 74   Temp 98 F (36.7 C)   Resp 20   Ht 5\' 2"  (1.575 m)   Wt 133 lb 4.8 oz (  60.5 kg)   SpO2 93%   BMI 24.38 kg/m   Patient's Medications  New Prescriptions   No medications on file  Previous Medications   ACETAMINOPHEN (TYLENOL) 325 MG TABLET    Take 650 mg by mouth every 4 (four) hours as needed.   AMINO ACIDS-PROTEIN HYDROLYS (FEEDING SUPPLEMENT, PRO-STAT SUGAR FREE 64,) LIQD    Take 30 mLs by mouth 2 (two) times daily.   CHOLECALCIFEROL 5000 UNITS CAPSULE    Take 5,000 Units by mouth daily.   CLOBETASOL OINTMENT (TEMOVATE) 0.05 %    Apply 1 application topically 2 (two) times daily.   ENOXAPARIN (LOVENOX) 30 MG/0.3ML INJECTION    Inject 0.3 mLs (30 mg total) into the skin every 12 (twelve) hours for 14 days.   MULTIPLE VITAMINS-MINERALS (CENTRUM SILVER) TABLET    Take 1 tablet by mouth daily.   NITROFURANTOIN (MACRODANTIN) 100 MG CAPSULE    Take 100 mg by mouth daily.    NUTRITIONAL SUPPLEMENTS (ENSURE ENLIVE PO)    Take 1 Bottle by mouth 2 (two) times daily.   PRAVASTATIN (PRAVACHOL) 40 MG TABLET    Take 40 mg by mouth at bedtime.   Modified Medications    No medications on file  Discontinued Medications   TRAMADOL (ULTRAM) 50 MG TABLET    Take 1-2 tablets (50-100 mg total) by mouth every 4 (four) hours as needed for moderate pain.     SIGNIFICANT DIAGNOSTIC EXAMS  LABS REVIEWED: TODAY:   05-17-18:  Wbc 10.1; hgb 11.1; hct 32.5; mcv 94.5; plt 179; glucose 112; bun 9; creat 0.52; k+ 3.5; na ++ 139; ca 8.2 liver normal albumin 3.2   Review of Systems  Constitutional: Negative for malaise/fatigue.  Respiratory: Negative for cough and shortness of breath.   Cardiovascular: Negative for chest pain, palpitations and leg swelling.  Gastrointestinal: Negative for abdominal pain, constipation and heartburn.  Musculoskeletal: Negative for back pain, joint pain and myalgias.  Skin: Negative.   Neurological: Negative for dizziness.  Psychiatric/Behavioral: The patient is not nervous/anxious.     Physical Exam  Constitutional: She is oriented to person, place, and time. She appears well-developed and well-nourished. No distress.  Neck: No thyromegaly present.  Cardiovascular: Normal rate, regular rhythm, normal heart sounds and intact distal pulses.  Pulmonary/Chest: Effort normal and breath sounds normal. No respiratory distress.  Abdominal: Soft. Bowel sounds are normal. She exhibits no distension. There is no tenderness.  Musculoskeletal: She exhibits no edema.  Is able to move all extremities Status post right hip replacement   Lymphadenopathy:    She has no cervical adenopathy.  Neurological: She is alert and oriented to person, place, and time.  Skin: Skin is warm and dry. She is not diaphoretic.  Psychiatric: She has a normal mood and affect.    ASSESSMENT/ PLAN:   Patient is being discharged with the following home health services:  Pt/ot to evaluate and treat as indicated for gait balance strength adl training  Patient is being discharged with the following durable medical equipment:  Standard wheelchair with cushion foot rests  anti-tippers; brake extensions to allow patient to maintain current level of independence with her adls which cannot be achieved with a walker can self propel.   Patient has been advised to f/u with their PCP in 1-2 weeks to bring them up to date on their rehab stay.  Social services at facility was responsible for arranging this appointment.  Pt was provided with a 30 day supply of prescriptions for medications and  refills must be obtained from their PCP.  For controlled substances, a more limited supply may be provided adequate until PCP appointment only.   A 30 supply of her prescription medications have been written per the list above   Time spent with patient and family: 45 minutes discussed needed dme; home health needs and expectations and medications. Verbalized understanding.     Synthia Innocenteborah Green NP [facility]  Contact [phone] Monday through Friday 8am- 5pm  After hours call [phone]

## 2021-12-29 ENCOUNTER — Other Ambulatory Visit: Payer: Self-pay

## 2021-12-29 ENCOUNTER — Observation Stay
Admission: EM | Admit: 2021-12-29 | Discharge: 2021-12-30 | Disposition: A | Discharge status: Home Health | Payer: Medicare Other | Attending: Family Medicine | Admitting: Family Medicine

## 2021-12-29 ENCOUNTER — Observation Stay: Payer: Medicare Other

## 2021-12-29 ENCOUNTER — Emergency Department: Payer: Medicare Other

## 2021-12-29 ENCOUNTER — Encounter: Payer: Self-pay | Admitting: Emergency Medicine

## 2021-12-29 DIAGNOSIS — Z79899 Other long term (current) drug therapy: Secondary | ICD-10-CM | POA: Diagnosis not present

## 2021-12-29 DIAGNOSIS — F0390 Unspecified dementia without behavioral disturbance: Secondary | ICD-10-CM

## 2021-12-29 DIAGNOSIS — E785 Hyperlipidemia, unspecified: Secondary | ICD-10-CM

## 2021-12-29 DIAGNOSIS — Z20822 Contact with and (suspected) exposure to covid-19: Secondary | ICD-10-CM | POA: Insufficient documentation

## 2021-12-29 DIAGNOSIS — R29810 Facial weakness: Secondary | ICD-10-CM | POA: Diagnosis not present

## 2021-12-29 DIAGNOSIS — I639 Cerebral infarction, unspecified: Principal | ICD-10-CM | POA: Insufficient documentation

## 2021-12-29 DIAGNOSIS — R911 Solitary pulmonary nodule: Secondary | ICD-10-CM | POA: Insufficient documentation

## 2021-12-29 DIAGNOSIS — Z87891 Personal history of nicotine dependence: Secondary | ICD-10-CM | POA: Insufficient documentation

## 2021-12-29 DIAGNOSIS — G309 Alzheimer's disease, unspecified: Secondary | ICD-10-CM | POA: Insufficient documentation

## 2021-12-29 DIAGNOSIS — R41841 Cognitive communication deficit: Secondary | ICD-10-CM | POA: Insufficient documentation

## 2021-12-29 DIAGNOSIS — F0280 Dementia in other diseases classified elsewhere without behavioral disturbance: Secondary | ICD-10-CM | POA: Diagnosis not present

## 2021-12-29 DIAGNOSIS — E876 Hypokalemia: Secondary | ICD-10-CM | POA: Diagnosis not present

## 2021-12-29 DIAGNOSIS — G459 Transient cerebral ischemic attack, unspecified: Secondary | ICD-10-CM | POA: Diagnosis present

## 2021-12-29 DIAGNOSIS — Z96641 Presence of right artificial hip joint: Secondary | ICD-10-CM | POA: Insufficient documentation

## 2021-12-29 LAB — DIFFERENTIAL
Abs Immature Granulocytes: 0.03 10*3/uL (ref 0.00–0.07)
Basophils Absolute: 0.1 10*3/uL (ref 0.0–0.1)
Basophils Relative: 1 %
Eosinophils Absolute: 0.3 10*3/uL (ref 0.0–0.5)
Eosinophils Relative: 4 %
Immature Granulocytes: 0 %
Lymphocytes Relative: 33 %
Lymphs Abs: 2.4 10*3/uL (ref 0.7–4.0)
Monocytes Absolute: 0.8 10*3/uL (ref 0.1–1.0)
Monocytes Relative: 11 %
Neutro Abs: 3.7 10*3/uL (ref 1.7–7.7)
Neutrophils Relative %: 51 %

## 2021-12-29 LAB — COMPREHENSIVE METABOLIC PANEL
ALT: 16 U/L (ref 0–44)
AST: 25 U/L (ref 15–41)
Albumin: 4.1 g/dL (ref 3.5–5.0)
Alkaline Phosphatase: 62 U/L (ref 38–126)
Anion gap: 5 (ref 5–15)
BUN: 12 mg/dL (ref 8–23)
CO2: 28 mmol/L (ref 22–32)
Calcium: 8.9 mg/dL (ref 8.9–10.3)
Chloride: 108 mmol/L (ref 98–111)
Creatinine, Ser: 0.67 mg/dL (ref 0.44–1.00)
GFR, Estimated: >60 mL/min (ref >60)
Glucose, Bld: 114 mg/dL — ABNORMAL HIGH (ref 70–99)
Potassium: 3.3 mmol/L — ABNORMAL LOW (ref 3.5–5.1)
Sodium: 141 mmol/L (ref 135–145)
Total Bilirubin: 0.7 mg/dL (ref 0.3–1.2)
Total Protein: 6.8 g/dL (ref 6.5–8.1)

## 2021-12-29 LAB — PROTIME-INR
INR: 1 (ref 0.8–1.2)
Prothrombin Time: 12.9 seconds (ref 11.4–15.2)

## 2021-12-29 LAB — CBC
HCT: 40.3 % (ref 36.0–46.0)
Hemoglobin: 13.4 g/dL (ref 12.0–15.0)
MCH: 30.9 pg (ref 26.0–34.0)
MCHC: 33.3 g/dL (ref 30.0–36.0)
MCV: 93.1 fL (ref 80.0–100.0)
Platelets: 205 10*3/uL (ref 150–400)
RBC: 4.33 MIL/uL (ref 3.87–5.11)
RDW: 12.8 % (ref 11.5–15.5)
WBC: 7.4 10*3/uL (ref 4.0–10.5)
nRBC: 0 % (ref 0.0–0.2)

## 2021-12-29 LAB — RESP PANEL BY RT-PCR (FLU A&B, COVID) ARPGX2
Influenza A by PCR: NEGATIVE
Influenza B by PCR: NEGATIVE
SARS Coronavirus 2 by RT PCR: NEGATIVE

## 2021-12-29 LAB — APTT: aPTT: 27 seconds (ref 24–36)

## 2021-12-29 MED ORDER — CLOBETASOL PROPIONATE 0.05 % EX OINT
1.0000 "application " | TOPICAL_OINTMENT | Freq: Two times a day (BID) | CUTANEOUS | Status: DC
  Filled 2021-12-29: qty 15

## 2021-12-29 MED ORDER — ACETAMINOPHEN 325 MG PO TABS: 650.0000 mg | ORAL_TABLET | Freq: Four times a day (QID) | ORAL | Status: DC | PRN

## 2021-12-29 MED ORDER — ONDANSETRON HCL 4 MG/2ML IJ SOLN: 4.0000 mg | Freq: Four times a day (QID) | INTRAMUSCULAR | Status: DC | PRN

## 2021-12-29 MED ORDER — POTASSIUM CHLORIDE 20 MEQ PO PACK
40.0000 meq | PACK | Freq: Once | ORAL | Status: AC
Start: 2021-12-29 — End: 2021-12-29
  Administered 2021-12-29: 40 meq via ORAL
  Filled 2021-12-29: qty 2

## 2021-12-29 MED ORDER — ENSURE ENLIVE PO LIQD
1.0000 | Freq: Two times a day (BID) | ORAL | Status: DC
  Administered 2021-12-30 (×2): 237 mL via ORAL

## 2021-12-29 MED ORDER — PRAVASTATIN SODIUM 20 MG PO TABS: 40.0000 mg | ORAL_TABLET | Freq: Every day | ORAL | Status: DC

## 2021-12-29 MED ORDER — POTASSIUM CHLORIDE 20 MEQ PO PACK: 40.0000 meq | PACK | Freq: Once | ORAL | Status: DC

## 2021-12-29 MED ORDER — TRAZODONE HCL 50 MG PO TABS: 25.0000 mg | ORAL_TABLET | Freq: Every evening | ORAL | Status: DC | PRN

## 2021-12-29 MED ORDER — ONDANSETRON HCL 4 MG PO TABS: 4.0000 mg | ORAL_TABLET | Freq: Four times a day (QID) | ORAL | Status: DC | PRN

## 2021-12-29 MED ORDER — ADULT MULTIVITAMIN W/MINERALS CH
1.0000 | ORAL_TABLET | Freq: Every day | ORAL | Status: DC
  Administered 2021-12-30: 09:00:00 1 via ORAL
  Filled 2021-12-29: qty 1

## 2021-12-29 MED ORDER — POTASSIUM CHLORIDE IN NACL 20-0.9 MEQ/L-% IV SOLN
INTRAVENOUS | Status: DC
  Filled 2021-12-29 (×2): qty 1000

## 2021-12-29 MED ORDER — STROKE: EARLY STAGES OF RECOVERY BOOK: Freq: Once | Status: AC

## 2021-12-29 MED ORDER — ACETAMINOPHEN 650 MG RE SUPP: 650.0000 mg | Freq: Four times a day (QID) | RECTAL | Status: DC | PRN

## 2021-12-29 MED ORDER — PRO-STAT SUGAR FREE PO LIQD
30.0000 mL | Freq: Two times a day (BID) | ORAL | Status: DC
  Administered 2021-12-30: 30 mL via ORAL

## 2021-12-29 MED ORDER — MAGNESIUM HYDROXIDE 400 MG/5ML PO SUSP: 30.0000 mL | Freq: Every day | ORAL | Status: DC | PRN

## 2021-12-29 MED ORDER — ENOXAPARIN SODIUM 40 MG/0.4ML IJ SOSY: 40.0000 mg | PREFILLED_SYRINGE | INTRAMUSCULAR | Status: DC

## 2021-12-29 MED ORDER — VITAMIN D 25 MCG (1000 UNIT) PO TABS
5000.0000 [IU] | ORAL_TABLET | Freq: Every day | ORAL | Status: DC
  Administered 2021-12-30: 5000 [IU] via ORAL
  Filled 2021-12-29: qty 5

## 2021-12-29 NOTE — ED Triage Notes (Signed)
First Nurse Note:  Arrives with left sided facial droop since Saturday.  Caregiver reports a fall on Saturday.  Today also c/o tingling to left face.  AAOx3.  Skin warm and dry. NAD

## 2021-12-29 NOTE — ED Notes (Addendum)
Duplicate charting. Note made in error.

## 2021-12-29 NOTE — ED Triage Notes (Signed)
Pt resides at [facility]. Pt also has caregiver that comes to assist. Caregiver reports pt with left sided facial droop since Monday. Per Caregiver, pt ate with family on Sunday and she got sick with NV. That evening she fell out of the bed and hit her right leg. Bandage in place, no stitches needed per staff at [facility]. Caregiver reports today at lunchtime she reported to her that her face felt strange on the right side but caregiver reports the left side of her face has been droopy since her arrival Monday and the tingling started on the opposite side today. Pt is alert, ambulatory with walker, able to speak in complete sentences. Pt with left sided droop. Pt denies hitting head when she fell out of the bed Sunday. Caregiver reports pt is normal in all other aspects of her daily living and baseline. Grips equal bilaterally.

## 2021-12-29 NOTE — ED Provider Notes (Signed)
[facility] Provider Note    Event Date/Time   First MD Initiated Contact with Patient 12/29/21 1747     (approximate)   History   Facial Droop   HPI  Allison Madden is a 88 y.o. female   here with facial droop. Per report, pt has had facial droop for past several days. Caregiver noticed that pt had a mild droop and was slightly weaker than usual on Monday, but she had actually had a fall and n/v that lasted 24 hr over the weekend so she thought it was related to that. She began to have tingling in her R face today, so she was brought to the ED. She continues to feel tingling, asymmetry of face but no other difficulties. No slurred speech. No focal numbness, weakness of UE or LE. No headaches. No h/o stroke. No recent med changes. Pt is otherwise fairly well at baseline. No CP, SOB.      Physical Exam   Triage Vital Signs: ED Triage Vitals  Enc Vitals Group     BP 12/29/21 1625 (!) 157/89     Pulse Rate 12/29/21 1625 69     Resp 12/29/21 1625 17     Temp 12/29/21 1625 98.9 F (37.2 C)     Temp Source 12/29/21 1625 Oral     SpO2 12/29/21 1625 95 %     Weight 12/29/21 1629 134 lb 7.7 oz (61 kg)     Height 12/29/21 1629 5\' 1"  (1.549 m)     Head Circumference --      Peak Flow --      Pain Score 12/29/21 1629 0     Pain Loc --      Pain Edu? --      Excl. in [place]? --     Most recent vital signs: Vitals:   12/29/21 2129 12/29/21 2234  BP:  (!) 171/76  Pulse:  (!) 55  Resp: 16 19  Temp: 98.5 F (36.9 C) 98.2 F (36.8 C)  SpO2:  98%     General: Awake, no distress.  CV:  Good peripheral perfusion. RRR, no murmurs. Resp:  Normal effort. Lungs CTAB, no w/r/r. Abd:  No distention. No tenderness. Other:  Left facial droop sparing forehead. Subjective decreased sensation on R hemiface. Strength 5/5 bl UE and LE. Gait deferred.   ED Results / Procedures / Treatments   Labs (all labs ordered are listed, but only abnormal results are  displayed) Labs Reviewed  COMPREHENSIVE METABOLIC PANEL - Abnormal; Notable for the following components:      Result Value   Potassium 3.3 (*)    Glucose, Bld 114 (*)    All other components within normal limits  RESP PANEL BY RT-PCR (FLU A&B, COVID) ARPGX2  PROTIME-INR  APTT  CBC  DIFFERENTIAL  HEMOGLOBIN A1C  LIPID PANEL  BASIC METABOLIC PANEL  CBC     EKG Normal sinus rhythm, VR 71. PR 176, QRS 80, QTc 445. No acute ST elevations or depressions. No ischemia or infarct.   RADIOLOGY CT Head: [place] CT C Spine: negative   I also independently reviewed and agree wit radiologist interpretations.   PROCEDURES:  Critical Care performed: No  .1-3 Lead EKG Interpretation Performed by: Duffy Bruce, MD Authorized by: Duffy Bruce, MD     Interpretation: abnormal     ECG rate:  50-60   ECG rate assessment: bradycardic     Rhythm: sinus bradycardia     Ectopy: none  Conduction: normal   Comments:     Indication: Stroke    MEDICATIONS ORDERED IN ED: Medications  pravastatin (PRAVACHOL) tablet 40 mg (has no administration in time range)  feeding supplement (PRO-STAT SUGAR FREE 64) liquid 30 mL (has no administration in time range)  cholecalciferol (VITAMIN D3) tablet 5,000 Units (has no administration in time range)  multivitamin with minerals tablet 1 tablet (has no administration in time range)  feeding supplement (ENSURE ENLIVE / ENSURE PLUS) liquid 237 mL (has no administration in time range)  clobetasol ointment (TEMOVATE) [id number] % 1 application (has no administration in time range)   stroke: mapping our early stages of recovery book (has no administration in time range)  enoxaparin (LOVENOX) injection 40 mg (has no administration in time range)  0.9 % NaCl with KCl 20 mEq/ L  infusion (has no administration in time range)  acetaminophen (TYLENOL) tablet 650 mg (has no administration in time range)    Or  acetaminophen (TYLENOL) suppository 650 mg (has  no administration in time range)  traZODone (DESYREL) tablet 25 mg (has no administration in time range)  magnesium hydroxide (MILK OF MAGNESIA) suspension 30 mL (has no administration in time range)  ondansetron (ZOFRAN) tablet 4 mg (has no administration in time range)    Or  ondansetron (ZOFRAN) injection 4 mg (has no administration in time range)  potassium chloride (KLOR-CON) packet 40 mEq (has no administration in time range)  potassium chloride (KLOR-CON) packet 40 mEq (40 mEq Oral Given 12/29/21 1932)     IMPRESSION / MDM / ASSESSMENT AND PLAN / ED COURSE  I reviewed the triage vital signs and the nursing notes.                               The patient is on the cardiac monitor to evaluate for evidence of arrhythmia and/or significant heart rate changes.   Ddx:  Stroke, Bell's palsy, mass/lesion   MDM:  88 year old female here with left facial droop.  Patient is well outside of tPA window.  Suspect likely acute infarct, CT head shows no acute abnormality.  CT C-spine negative for her fall.  Lab work is overall fairly reassuring.  No leukocytosis or anemia.  CMP with normal renal function.  INR normal.  COVID-negative.  Given significant focal findings, will plan to admit for stroke evaluation and MRI.  Patient updated and is in agreement.  Blood pressure elevated but will allow permissive hypertension in setting of what I suspect is a recent stroke.  Reviewed records from 9/22 visit with Dr. Sabra Heck, which mention dementia, hyperlipidemia but no note of previous stroke.   MEDICATIONS GIVEN IN ED: Medications  pravastatin (PRAVACHOL) tablet 40 mg (has no administration in time range)  feeding supplement (PRO-STAT SUGAR FREE 64) liquid 30 mL (has no administration in time range)  cholecalciferol (VITAMIN D3) tablet 5,000 Units (has no administration in time range)  multivitamin with minerals tablet 1 tablet (has no administration in time range)  feeding supplement (ENSURE ENLIVE  / ENSURE PLUS) liquid 237 mL (has no administration in time range)  clobetasol ointment (TEMOVATE) [id number] % 1 application (has no administration in time range)   stroke: mapping our early stages of recovery book (has no administration in time range)  enoxaparin (LOVENOX) injection 40 mg (has no administration in time range)  0.9 % NaCl with KCl 20 mEq/ L  infusion (has no administration in time range)  acetaminophen (TYLENOL)  tablet 650 mg (has no administration in time range)    Or  acetaminophen (TYLENOL) suppository 650 mg (has no administration in time range)  traZODone (DESYREL) tablet 25 mg (has no administration in time range)  magnesium hydroxide (MILK OF MAGNESIA) suspension 30 mL (has no administration in time range)  ondansetron (ZOFRAN) tablet 4 mg (has no administration in time range)    Or  ondansetron (ZOFRAN) injection 4 mg (has no administration in time range)  potassium chloride (KLOR-CON) packet 40 mEq (has no administration in time range)  potassium chloride (KLOR-CON) packet 40 mEq (40 mEq Oral Given 12/29/21 1932)     Consults:  Hospitalist consulted, will admit   EMR reviewed  Office visit with Dr. Sabra Heck, 07/2021     FINAL CLINICAL IMPRESSION(S) / ED DIAGNOSES   Final diagnoses:  Acute stroke due to ischemia [facility])     Rx / DC Orders   ED Discharge Orders     None        Note:  This document was prepared using Dragon voice recognition software and may include unintentional dictation errors.   Duffy Bruce, MD 12/30/21 [phone]

## 2021-12-29 NOTE — ED Notes (Signed)
Pt alert,  NAD, calm, interactive, resps e/u. Caregiver and EDP at [facility].

## 2021-12-29 NOTE — ED Notes (Signed)
Patient transported to MRI 

## 2021-12-29 NOTE — H&P (Signed)
[place]   PATIENT NAME: Allison Madden    MR#:  [id number]  DATE OF BIRTH:  05/25/1929  DATE OF ADMISSION:  12/29/2021  PRIMARY CARE PHYSICIAN: Danella Penton, MD   Patient is coming from: Home  REQUESTING/REFERRING PHYSICIAN: Shaune Pollack, MD  CHIEF COMPLAINT:   Chief Complaint  Patient presents with   Facial Droop    HISTORY OF PRESENT ILLNESS:  Allison Madden is a 88 y.o. Caucasian female with medical history significant for dyslipidemia and macular degeneration, who presented to the emergency room with acute onset of left facial droop that started on Monday a.m. with no dysphagia and dysarthria as well as dizziness.  This was associated with right facial tingling and numbness that has resolved.  The patient has been having nausea and vomiting on Saturday and has been feeling weak all over with mild unsteady gait.  No urinary or stool incontinence.  No witnessed seizures.  No vertigo or tinnitus.  ED Course: Upon presentation to the emergency room, blood pressure was 157/89 with otherwise normal vital signs.  Potassium was 3.3 with otherwise unremarkable CMP and CBC.  Influenza antigens and COVID-19 PCR came back negative. EKG as reviewed by me : EKG showed normal sinus rhythm with a rate of 71 with sinus arrhythmia, left anterior fascicular block Imaging: Noncontrast head CT scan revealed chronic atrophy and small vessel ischemic changes with no acute intracranial normalities.  C-spine CT showed diffuse degenerative change throughout the cervical spine with no acute findings. Brain MRI without contrast revealed the following: 1. Two adjacent punctate foci of restricted diffusion involving the left dorsal pons/pontomedullary junction, consistent with acute ischemic infarcts. No associated hemorrhage or mass effect. 2. No other acute intracranial abnormality. 3. Moderately advanced age-related cerebral atrophy.  The patient was given 40 mill: P.o. potassium  chloride.  She will be admitted to an observation medical telemetry bed for further evaluation and management.  PAST MEDICAL HISTORY:   Past Medical History:  Diagnosis Date   Hyperlipidemia    Macular degeneration     PAST SURGICAL HISTORY:   Past Surgical History:  Procedure Laterality Date   EYE SURGERY     TONSILLECTOMY     TOTAL HIP ARTHROPLASTY Right 05/14/2018   Procedure: TOTAL HIP ARTHROPLASTY;  Surgeon: Donato Heinz, MD;  Location: [facility] ORS;  Service: Orthopedics;  Laterality: Right;    SOCIAL HISTORY:   Social History   Tobacco Use   Smoking status: Former    Packs/day: 0.50    Years: 20.00    Pack years: 10.00    Types: Cigarettes    Quit date: 05/03/1971    Years since quitting: 50.6   Smokeless tobacco: Never   Tobacco comments:    quit 47 years ago  Substance Use Topics   Alcohol use: Yes    Alcohol/week: 7.0 standard drinks    Types: 7 Glasses of wine per week    FAMILY HISTORY:  Positive for hypertension.  DRUG ALLERGIES:   Allergies  Allergen Reactions   Tetracycline Nausea Only    Confusion    REVIEW OF SYSTEMS:   ROS As per history of present illness. All pertinent systems were reviewed above. Constitutional, HEENT, cardiovascular, respiratory, GI, GU, musculoskeletal, neuro, psychiatric, endocrine, integumentary and hematologic systems were reviewed and are otherwise negative/unremarkable except for positive findings mentioned above in the HPI.   MEDICATIONS AT HOME:   Prior to Admission medications   Medication Sig Start Date End Date Taking?  Authorizing Provider  acetaminophen (TYLENOL) 325 MG tablet Take 650 mg by mouth every 4 (four) hours as needed.   Yes [provider]  Amino Acids-Protein Hydrolys (FEEDING SUPPLEMENT, PRO-STAT SUGAR FREE 64,) LIQD Take 30 mLs by mouth 2 (two) times daily.   Yes [provider]  Cholecalciferol 5000 units capsule Take 5,000 Units by mouth daily.   Yes [provider]  clobetasol ointment (TEMOVATE) 0.05 % Apply 1 application topically 2 (two) times daily. 08/21/17  Yes [provider]  Multiple Vitamins-Minerals (CENTRUM SILVER) tablet Take 1 tablet by mouth daily.   Yes [provider]  nitrofurantoin (MACRODANTIN) 50 MG capsule Take by mouth. 12/24/21  Yes [provider]  pravastatin (PRAVACHOL) 40 MG tablet Take 40 mg by mouth at bedtime.    Yes [provider]  enoxaparin (LOVENOX) 30 MG/0.3ML injection Inject 0.3 mLs (30 mg total) into the skin every 12 (twelve) hours for 14 days. 05/17/18 05/31/18  Tera Partridge, PA  memantine (NAMENDA) 5 MG tablet Take 5 mg by mouth daily. Patient not taking: Reported on 12/29/2021 08/19/21   [provider]  Nutritional Supplements (ENSURE ENLIVE PO) Take 1 Bottle by mouth 2 (two) times daily.    [provider]      VITAL SIGNS:  Blood pressure (!) 171/58, pulse (!) 57, temperature 98.9 F (37.2 C), temperature source Oral, resp. rate 16, height 5\' 1"  (1.549 m), weight 61 kg, SpO2 97 %.  PHYSICAL EXAMINATION:  Physical Exam  GENERAL:  88 y.o.-year-old Caucasian female patient lying in the bed with no acute distress.  EYES: Pupils equal, round, reactive to light and accommodation. No scleral icterus. Extraocular muscles intact.  HEENT: Head atraumatic, normocephalic. Oropharynx and nasopharynx clear.  NECK:  Supple, no jugular venous distention. No thyroid enlargement, no tenderness.  LUNGS: Normal breath sounds bilaterally, no wheezing, rales,rhonchi or crepitation. No use of accessory muscles of respiration.  CARDIOVASCULAR: Regular rate and rhythm, S1, S2 normal. No murmurs, rubs, or gallops.  ABDOMEN: Soft, nondistended, nontender. Bowel sounds present. No organomegaly or mass.  EXTREMITIES: No pedal edema, cyanosis, or clubbing.  NEUROLOGIC: Cranial nerves II through XII are intact except for left facial droop. Muscle strength 5/5 in all extremities.  Sensation intact. Gait not checked.  PSYCHIATRIC: The patient is alert and oriented x 3.  Normal affect and good eye contact. SKIN: No obvious rash, lesion, or ulcer.   LABORATORY PANEL:   CBC Recent Labs  Lab 12/29/21 1631  WBC 7.4  HGB 13.4  HCT 40.3  PLT 205   ------------------------------------------------------------------------------------------------------------------  Chemistries  Recent Labs  Lab 12/29/21 1631  NA 141  K 3.3*  CL 108  CO2 28  GLUCOSE 114*  BUN 12  CREATININE 0.67  CALCIUM 8.9  AST 25  ALT 16  ALKPHOS 62  BILITOT 0.7   ------------------------------------------------------------------------------------------------------------------  Cardiac Enzymes No results for input(s): TROPONINI in the last 168 hours. ------------------------------------------------------------------------------------------------------------------  RADIOLOGY:  CT HEAD WO CONTRAST  Result Date: 12/29/2021 CLINICAL DATA:  Head trauma, minor. Left-sided facial droop since Saturday. Fell on Saturday. EXAM: CT HEAD WITHOUT CONTRAST TECHNIQUE: Contiguous axial images were obtained from the base of the skull through the vertex without intravenous contrast. RADIATION DOSE REDUCTION: This exam was performed according to the departmental dose-optimization program which includes automated exposure control, adjustment of the mA and/or kV according to patient size and/or use of iterative reconstruction technique. COMPARISON:  None. FINDINGS: Brain: Diffuse cerebral atrophy. Ventricular dilatation consistent with central  atrophy. Low-attenuation changes in the deep white matter consistent with small vessel ischemia. No abnormal extra-axial fluid collections. No mass effect or midline shift. Gray-white matter junctions are distinct. Basal cisterns are not effaced. No acute intracranial hemorrhage. Vascular: Moderate intracranial arterial vascular calcifications. Skull: Calvarium appears  intact. Small subcutaneous scalp hematoma over the left posterior parietal region. Sinuses/Orbits: Paranasal sinuses and mastoid air cells are clear. Other: None. IMPRESSION: No acute intracranial abnormalities. Chronic atrophy and small vessel ischemic changes. Electronically Signed   By: Burman NievesWilliam  Stevens M.D.   On: 12/29/2021 17:03   CT Cervical Spine Wo Contrast  Result Date: 12/29/2021 CLINICAL DATA:  Neck trauma after a fall on Saturday EXAM: CT CERVICAL SPINE WITHOUT CONTRAST TECHNIQUE: Multidetector CT imaging of the cervical spine was performed without intravenous contrast. Multiplanar CT image reconstructions were also generated. RADIATION DOSE REDUCTION: This exam was performed according to the departmental dose-optimization program which includes automated exposure control, adjustment of the mA and/or kV according to patient size and/or use of iterative reconstruction technique. COMPARISON:  CT neck 06/30/2017 FINDINGS: Alignment: Slight anterior subluxations at C6-7 with slight retrolisthesis of C3 on C4 and C4 on C5. Alignment is unchanged since prior study suggesting this is likely degenerative. Normal alignment of the posterior elements. C1-2 articulation appears intact. Skull base and vertebrae: The skull base appears intact. No vertebral compression deformities. No focal bone lesion or bone destruction. Bone cortex appears intact. Soft tissues and spinal canal: No prevertebral soft tissue swelling. No abnormal paraspinal soft tissue mass or infiltration. Vascular calcifications in the cervical carotid arteries. Disc levels: Diffuse degenerative change with narrowed interspaces and endplate osteophyte formation throughout. Degenerative changes in the facet joints. Degenerative changes at C1-2. Upper chest: Visualized lung apices are clear. Other: None. IMPRESSION: Diffuse degenerative change throughout the cervical spine. No acute displaced fractures identified. Electronically Signed   By: Burman NievesWilliam   Stevens M.D.   On: 12/29/2021 17:05      IMPRESSION AND PLAN:  Principal Problem:   TIA (transient ischemic attack) 1.  Left dorsal pons/pontomedullary junction  acute ischemic infarcts with left facial droop and numbness. - The patient will be admitted to an observation medical telemetry bed. - Neurochecks will be followed. - The patient will be placed on aspirin as well as statin therapy. - Fasting lipids will be checked. - Neurology consult will be obtained.  I notified Dr. Wilford CornerArora about the patient. - PT/OT/ST consults will be obtained.  2.  Hypokalemia. - Potassium will be replaced.  3.  Dyslipidemia. - We will continue statin therapy.  4.  Alzheimer's dementia. - We will continue Namenda.   DVT prophylaxis: Lovenox. Code Status: full code. Family Communication:  The plan of care was discussed in details with the patient (and family). I answered all questions. The patient agreed to proceed with the above mentioned plan. Further management will depend upon hospital course. Disposition Plan: Back to previous home environment Consults called: Neurology. All the records are reviewed and case discussed with ED provider.  Status is: Observation  I certify that at the time of admission, it is my clinical judgment that the patient will require inpatient hospital care extending less than 2 midnights.                            Dispo: The patient is from: Home              Anticipated d/c is to: Home  Patient currently is not medically stable to d/c.              Difficult to place patient: No   Hannah Beat M.D on 12/29/2021 at 9:09 PM  [facility]   From 7 PM-7 AM, contact night-coverage [email or web address]  CC: Primary care physician; Danella Penton, MD

## 2021-12-29 NOTE — ED Notes (Signed)
Pt placed on purewick 

## 2021-12-30 ENCOUNTER — Observation Stay: Payer: Medicare Other

## 2021-12-30 ENCOUNTER — Encounter: Payer: Self-pay | Admitting: Family Medicine

## 2021-12-30 ENCOUNTER — Telehealth: Payer: Self-pay | Admitting: Physician Assistant

## 2021-12-30 ENCOUNTER — Observation Stay (HOSPITAL_BASED_OUTPATIENT_CLINIC_OR_DEPARTMENT_OTHER)
Admit: 2021-12-30 | Discharge: 2021-12-30 | Disposition: A | Discharge status: Home / Self Care | Payer: Medicare Other | Attending: Family Medicine | Admitting: Family Medicine

## 2021-12-30 DIAGNOSIS — I6389 Other cerebral infarction: Secondary | ICD-10-CM

## 2021-12-30 DIAGNOSIS — F0390 Unspecified dementia without behavioral disturbance: Secondary | ICD-10-CM

## 2021-12-30 DIAGNOSIS — R911 Solitary pulmonary nodule: Secondary | ICD-10-CM

## 2021-12-30 DIAGNOSIS — I499 Cardiac arrhythmia, unspecified: Secondary | ICD-10-CM

## 2021-12-30 DIAGNOSIS — I639 Cerebral infarction, unspecified: Secondary | ICD-10-CM | POA: Diagnosis not present

## 2021-12-30 DIAGNOSIS — E876 Hypokalemia: Secondary | ICD-10-CM

## 2021-12-30 LAB — LIPID PANEL
Cholesterol: 150 mg/dL (ref 0–200)
HDL: 54 mg/dL (ref >40)
LDL Cholesterol: 82 mg/dL (ref 0–99)
Total CHOL/HDL Ratio: 2.8 RATIO
Triglycerides: 68 mg/dL (ref <150)
VLDL: 14 mg/dL (ref 0–40)

## 2021-12-30 LAB — CBC
HCT: 36.1 % (ref 36.0–46.0)
Hemoglobin: 11.9 g/dL — ABNORMAL LOW (ref 12.0–15.0)
MCH: 30.1 pg (ref 26.0–34.0)
MCHC: 33 g/dL (ref 30.0–36.0)
MCV: 91.2 fL (ref 80.0–100.0)
Platelets: 190 10*3/uL (ref 150–400)
RBC: 3.96 MIL/uL (ref 3.87–5.11)
RDW: 12.8 % (ref 11.5–15.5)
WBC: 8.5 10*3/uL (ref 4.0–10.5)
nRBC: 0 % (ref 0.0–0.2)

## 2021-12-30 LAB — HEMOGLOBIN A1C
Hgb A1c MFr Bld: 5.6 % (ref 4.8–5.6)
Mean Plasma Glucose: 114.02 mg/dL

## 2021-12-30 LAB — BASIC METABOLIC PANEL
Anion gap: 6 (ref 5–15)
BUN: 11 mg/dL (ref 8–23)
CO2: 27 mmol/L (ref 22–32)
Calcium: 8.4 mg/dL — ABNORMAL LOW (ref 8.9–10.3)
Chloride: 107 mmol/L (ref 98–111)
Creatinine, Ser: 0.58 mg/dL (ref 0.44–1.00)
GFR, Estimated: >60 mL/min (ref >60)
Glucose, Bld: 104 mg/dL — ABNORMAL HIGH (ref 70–99)
Potassium: 3.3 mmol/L — ABNORMAL LOW (ref 3.5–5.1)
Sodium: 140 mmol/L (ref 135–145)

## 2021-12-30 LAB — ECHOCARDIOGRAM LIMITED BUBBLE STUDY: S' Lateral: 2.63 cm

## 2021-12-30 MED ORDER — IOHEXOL 350 MG/ML SOLN
75.0000 mL | Freq: Once | INTRAVENOUS | Status: AC | PRN
  Administered 2021-12-30: 75 mL via INTRAVENOUS

## 2021-12-30 MED ORDER — POTASSIUM CHLORIDE 20 MEQ PO PACK
40.0000 meq | PACK | Freq: Once | ORAL | Status: AC
  Administered 2021-12-30: 13:00:00 40 meq via ORAL
  Filled 2021-12-30: qty 2

## 2021-12-30 MED ORDER — AMLODIPINE BESYLATE 5 MG PO TABS
5.0000 mg | ORAL_TABLET | Freq: Every day | ORAL | Status: DC
  Administered 2021-12-30: 5 mg via ORAL
  Filled 2021-12-30: qty 1

## 2021-12-30 MED ORDER — ASPIRIN 81 MG PO TBEC: 81.0000 mg | DELAYED_RELEASE_TABLET | Freq: Every day | ORAL | 11 refills | Status: DC

## 2021-12-30 MED ORDER — ASPIRIN EC 81 MG PO TBEC
81.0000 mg | DELAYED_RELEASE_TABLET | Freq: Every day | ORAL | Status: DC
  Administered 2021-12-30: 81 mg via ORAL
  Filled 2021-12-30: qty 1

## 2021-12-30 MED ORDER — ATORVASTATIN CALCIUM 20 MG PO TABS
40.0000 mg | ORAL_TABLET | Freq: Every day | ORAL | Status: DC
  Administered 2021-12-30: 40 mg via ORAL
  Filled 2021-12-30: qty 2

## 2021-12-30 NOTE — Assessment & Plan Note (Signed)
Repleted. °

## 2021-12-30 NOTE — Telephone Encounter (Signed)
Secure chat message received from [employer] PA-C as follows:  Can you please get her set up with a Zio XT (mailed) and follow up with MD only to establish care. Dx: CVA

## 2021-12-30 NOTE — Assessment & Plan Note (Addendum)
Incidental subcentimeter ground-glass opacities in the imaged left lower lobe and right upper lobe.   Findings most likely are infectious or inflammatory, but recommend follow-up chest CT in approximately 3-6 months to ensure resolution and exclude malignancy.

## 2021-12-30 NOTE — Discharge Summary (Addendum)
Physician Discharge Summary   Patient: Allison Madden MRN: [id number] DOB: 12/31/1928  Admit date:     12/29/2021  Discharge date: 12/30/21  Discharge Physician: Alberteen Samhristopher P Jeris Easterly   PCP: Danella PentonMiller, Mark F, MD   Recommendations at discharge:  Follow up with PCP in 1 week Follow up with Neurology, Dr. Sherryll BurgerShah, for new stroke in 4-6 weeks To have 30 day cardiac event monitor placed after discharge Dr. Hyacinth MeekerMiller: Please obtain CT chest in 3-6 months to follow lung nodule, see below        Discharge Diagnoses: Principal Problem:   Acute ischemic stroke [facility](HCC) Active Problems:   Hypokalemia   Dementia without behavioral disturbance (HCC)   Lung nodule       Hospital Course: Allison Madden is a 88 y.o. F with dyslipidemia and macular degeneration, who presented to the emergency room with acute onset of left facial droop that started 2 days prior to presentation with no dysphagia and dysarthria as well as dizziness.  This was associated with right facial tingling and numbness that has resolved.  In the ER, K 3.3.  CT head unremarkable.  Brain MRI showed two small punctate foci of acute infarct in the left dorsal pons/pontomedullary junction.    Assessment and Plan: * Acute ischemic stroke (HCC)- (present on admission) Had subcentimeter left lateral medullary infarct, consistent with dizziness, ataxia, left facial droop and right facial tingling. -Non-invasive angiography showed no significant intracranial atherosclorosis -Echocardiogram showed no cardiogenic source of embolism -Carotid imaging unremarkable   -Lipids ordered: discharged on pravastatin -Aspirin ordered at discharge: aspirin 81 mg daily -Atrial fibrillation: Not present on hospital monitoring, RECOMMEND CARDIAC MONITOR -tPA not given because outside the window -Dysphagia screen ordered in ER -PT eval ordered: recommended HH PT/OT -Smoking cessation: not pertinent   Lung nodule Incidental subcentimeter  ground-glass opacities in the imaged left lower lobe and right upper lobe.   Findings most likely are infectious or inflammatory, but recommend follow-up chest CT in approximately 3-6 months to ensure resolution and exclude malignancy.   Hypokalemia Repleted              [facility] [place] database was reviewed.    Consultants: Neurology Procedures performed: MRI brain, CTA head and neck, Echo  Disposition: Home health Diet recommendation:  Discharge Diet Orders (From admission, onward)     Start     Ordered   12/30/21 0000  Diet - low sodium heart healthy        12/30/21 1501           Cardiac diet  DISCHARGE MEDICATION: Allergies as of 12/30/2021       Reactions   Tetracycline Nausea Only   Confusion        Medication List     STOP taking these medications    enoxaparin 30 MG/0.3ML injection Commonly known as: LOVENOX   memantine 5 MG tablet Commonly known as: NAMENDA       TAKE these medications    acetaminophen 325 MG tablet Commonly known as: TYLENOL Take 650 mg by mouth every 4 (four) hours as needed.   aspirin 81 MG EC tablet Take 1 tablet (81 mg total) by mouth daily. Swallow whole. Start taking on: December 31, 2021   Centrum Silver tablet Take 1 tablet by mouth daily.   Cholecalciferol 125 MCG (5000 UT) capsule Take 5,000 Units by mouth daily.   clobetasol ointment 0.05 % Commonly known as: TEMOVATE Apply 1 application topically  2 (two) times daily.   ENSURE ENLIVE PO Take 1 Bottle by mouth 2 (two) times daily.   feeding supplement (PRO-STAT SUGAR FREE 64) Liqd Take 30 mLs by mouth 2 (two) times daily.   nitrofurantoin 50 MG capsule Commonly known as: MACRODANTIN Take by mouth.   pravastatin 40 MG tablet Commonly known as: PRAVACHOL Take 40 mg by mouth at bedtime.               Discharge Care Instructions  (From admission, onward)           Start      Ordered   12/30/21 0000  Discharge wound care:       Comments: Cover this with a nonstick dressing.   Cover with gauze. It is safe to try triple antibiotic ointment if you choose. Wash with warm water and pat dry once daily or if dressing is soiled. Seek care if this is swollen, draining or the pain gets much worse   12/30/21 1501            Follow-up Information     Danella Penton, MD. Schedule an appointment as soon as possible for a visit in 1 week(s).   Specialty: Internal Medicine Contact information: [phone] [facility] [address] [facility] [place] [facility] [place] [address] [phone]         Lonell Face, MD. Schedule an appointment as soon as possible for a visit in 1 month(s).   Specialty: Neurology Why: For stroke follow up Contact information: [address] [facility] [address] [facility] [place] [address] [phone]                Discharge Instructions     Diet - low sodium heart healthy   Complete by: As directed    Discharge instructions   Complete by: As directed    From Dr. Maryfrances Bunnell: You were admitted for a small stroke. This was in the left posterolateral medulla, which is part of the brainstem. It was likely the cause of your dizziness, nausea, left facial droop and right face tingling. We searched for causes of the stroke, and found nothing obvious. Your blood vessels of your neck are okay, the heart appears okay.    The only thing we recommend is to use a heart monitor for a month to make sure you have no arrhythmias that might have caused the stroke.  (We monitored your heart here, and saw nothing, but sometimes you have to watch for longer to catch events that only happen every few days or every few weeks)  The heart team will contact you to arrange this in a few days.  Continue your pravastatin START taking low dose aspirin 81 mg daily  Go see Dr. Hyacinth Meeker in a week and go see Dr. Sherryll Burger (the neurologist) for  follow up in 4-6 weeks (his information is below in the To Do section)   NOTE: you had one incidental finding while you were here, which is that we saw a small nodule in the upper part of your lungs.  This was small, but we recommend that Dr. Hyacinth Meeker order a repeat CT scan in 3-6 months to make sure this is not getting bigger.   Discharge wound care:   Complete by: As directed    Cover this with a nonstick dressing.   Cover with gauze. It is safe to try triple antibiotic ointment if you choose. Wash with warm water and pat dry once daily or if dressing is  soiled. Seek care if this is swollen, draining or the pain gets much worse   Increase activity slowly   Complete by: As directed        Discharge Exam: Filed Weights   12/29/21 1629 12/29/21 2237  Weight: 61 kg 59.3 kg   General: Pt is alert, awake, not in acute distress Cardiovascular: RRR, nl S1-S2, no murmurs appreciated.   No LE edema.   Respiratory: Normal respiratory rate and rhythm.  CTAB without rales or wheezes. Abdominal: Abdomen soft and non-tender.  No distension or HSM.   Neuro/Psych: Strength symmetric in upper and lower extremities.  Judgment and insight appear mildly impaired by dementia.  Mild left facial droop.  Mild right face paresthesias.   Condition at discharge: good  The results of significant diagnostics from this hospitalization (including imaging, microbiology, ancillary and laboratory) are listed below for reference.   Imaging Studies: CT ANGIO HEAD NECK W WO CM  Result Date: 12/30/2021 CLINICAL DATA:  Neuro deficit, acute, stroke suspected EXAM: CT ANGIOGRAPHY HEAD AND NECK TECHNIQUE: Multidetector CT imaging of the head and neck was performed using the standard protocol during bolus administration of intravenous contrast. Multiplanar CT image reconstructions and MIPs were obtained to evaluate the vascular anatomy. Carotid stenosis measurements (when applicable) are obtained utilizing NASCET criteria,  using the distal internal carotid diameter as the denominator. RADIATION DOSE REDUCTION: This exam was performed according to the departmental dose-optimization program which includes automated exposure control, adjustment of the mA and/or kV according to patient size and/or use of iterative reconstruction technique. CONTRAST:  28mL OMNIPAQUE IOHEXOL 350 MG/ML SOLN COMPARISON:  CT and MRI head December 29, 2021. FINDINGS: CT HEAD FINDINGS Brain: Known punctate infarcts seen on recent MRI are not visible by CT. No evidence of acute hemorrhage, hydrocephalus, mass lesion, midline shift, or extra-axial fluid. Similar chronic microvascular ischemic disease and atrophy. Vascular: See below. Skull: No acute fracture. Sinuses: Clear sinuses. Orbits: No acute finding. Review of the MIP images confirms the above findings CTA NECK FINDINGS Aortic arch: Great vessel origins are patent.  Atherosclerosis. Right carotid system: Patent. Mild atherosclerosis at the carotid bifurcation without greater than 50% stenosis. Left carotid system: Patent. Mild atherosclerosis at the carotid bifurcation without greater than 50% stenosis. Vertebral arteries: Patent. No significant (greater than 50%) stenosis. Skeleton: Multilevel degenerative disc disease and facet arthropathy with varying degrees of neural foraminal stenosis. Other neck: No acute abnormality on limited assessment. Upper chest: Approximately 7 mm ground-glass opacity in the lateral aspect of the imaged left lower lobe and subtle 6 mm ground-glass opacity in the right upper lobe. Review of the MIP images confirms the above findings CTA HEAD FINDINGS Anterior circulation: Bilateral intracranial ICAs, MCAs, and ACAs are patent without proximal hemodynamically significant stenosis. No aneurysm identified. Posterior circulation: Bilateral intradural vertebral arteries, basilar artery, and posterior cerebral arteries are patent without proximal hemodynamically significant stenosis.  Small vertebrobasilar system with prominent bilateral posterior communicating arteries and small bilateral P1 PCAs, anatomic variant. The visualized PICAS and superior cerebellar arteries are patent proximally. No aneurysm identified. Venous sinuses: As permitted by contrast timing, patent. Anatomic variants: Detailed above. Review of the MIP images confirms the above findings IMPRESSION: 1. No large vessel occlusion or proximal hemodynamically significant stenosis in the head or neck. 2. Approximately 7 mm ground-glass opacity in the lateral aspect of the imaged left lower lobe and subtle 6 mm ground-glass opacity in the right upper lobe. Findings most likely are infectious or inflammatory, but recommend follow-up chest  CT in approximately 3-6 months to ensure resolution and exclude malignancy. Recommendation follows the consensus statement: Guidelines for Management of Incidental Pulmonary Nodules Detected on CT Images: From the Fleischner Society 2017; Radiology 2017; 284:228-243. Electronically Signed   By: Feliberto Harts M.D.   On: 12/30/2021 11:01   CT HEAD WO CONTRAST  Result Date: 12/29/2021 CLINICAL DATA:  Head trauma, minor. Left-sided facial droop since Saturday. Fell on Saturday. EXAM: CT HEAD WITHOUT CONTRAST TECHNIQUE: Contiguous axial images were obtained from the base of the skull through the vertex without intravenous contrast. RADIATION DOSE REDUCTION: This exam was performed according to the departmental dose-optimization program which includes automated exposure control, adjustment of the mA and/or kV according to patient size and/or use of iterative reconstruction technique. COMPARISON:  None. FINDINGS: Brain: Diffuse cerebral atrophy. Ventricular dilatation consistent with central atrophy. Low-attenuation changes in the deep white matter consistent with small vessel ischemia. No abnormal extra-axial fluid collections. No mass effect or midline shift. Gray-white matter junctions are  distinct. Basal cisterns are not effaced. No acute intracranial hemorrhage. Vascular: Moderate intracranial arterial vascular calcifications. Skull: Calvarium appears intact. Small subcutaneous scalp hematoma over the left posterior parietal region. Sinuses/Orbits: Paranasal sinuses and mastoid air cells are clear. Other: None. IMPRESSION: No acute intracranial abnormalities. Chronic atrophy and small vessel ischemic changes. Electronically Signed   By: Burman Nieves M.D.   On: 12/29/2021 17:03   CT Cervical Spine Wo Contrast  Result Date: 12/29/2021 CLINICAL DATA:  Neck trauma after a fall on Saturday EXAM: CT CERVICAL SPINE WITHOUT CONTRAST TECHNIQUE: Multidetector CT imaging of the cervical spine was performed without intravenous contrast. Multiplanar CT image reconstructions were also generated. RADIATION DOSE REDUCTION: This exam was performed according to the departmental dose-optimization program which includes automated exposure control, adjustment of the mA and/or kV according to patient size and/or use of iterative reconstruction technique. COMPARISON:  CT neck 06/30/2017 FINDINGS: Alignment: Slight anterior subluxations at C6-7 with slight retrolisthesis of C3 on C4 and C4 on C5. Alignment is unchanged since prior study suggesting this is likely degenerative. Normal alignment of the posterior elements. C1-2 articulation appears intact. Skull base and vertebrae: The skull base appears intact. No vertebral compression deformities. No focal bone lesion or bone destruction. Bone cortex appears intact. Soft tissues and spinal canal: No prevertebral soft tissue swelling. No abnormal paraspinal soft tissue mass or infiltration. Vascular calcifications in the cervical carotid arteries. Disc levels: Diffuse degenerative change with narrowed interspaces and endplate osteophyte formation throughout. Degenerative changes in the facet joints. Degenerative changes at C1-2. Upper chest: Visualized lung apices are  clear. Other: None. IMPRESSION: Diffuse degenerative change throughout the cervical spine. No acute displaced fractures identified. Electronically Signed   By: Burman Nieves M.D.   On: 12/29/2021 17:05   MR BRAIN WO CONTRAST  Result Date: 12/29/2021 CLINICAL DATA:  Initial evaluation for neuro deficit, stroke suspected. EXAM: MRI HEAD WITHOUT CONTRAST TECHNIQUE: Multiplanar, multiecho pulse sequences of the brain and surrounding structures were obtained without intravenous contrast. COMPARISON:  Prior CT from earlier the same day. FINDINGS: Brain: Moderately advanced age-related cerebral atrophy. No significant cerebral white matter disease for age. Two adjacent punctate foci of restricted diffusion seen involving the left dorsal pons/pontomedullary junction (series 9, image 14, 13), along the floor of the fourth ventricle. No associated hemorrhage or mass effect. No other evidence for acute or subacute ischemia. Gray-white matter differentiation otherwise maintained. No other areas of chronic cortical infarction. No acute or chronic intracranial blood products elsewhere within the  brain. No mass lesion, midline shift or mass effect. No hydrocephalus or extra-axial fluid collection. Pituitary gland suprasellar region normal. Midline structures intact. 1 cm benign pineal cyst incidentally noted. Vascular: Major intracranial vascular flow voids are maintained. Skull and upper cervical spine: Craniocervical junction within normal limits. Bone marrow signal intensity normal. No scalp soft tissue abnormality. Sinuses/Orbits: Prior bilateral ocular lens replacement. Paranasal sinuses are clear. No significant mastoid effusion. Other: None. IMPRESSION: 1. Two adjacent punctate foci of restricted diffusion involving the left dorsal pons/pontomedullary junction, consistent with acute ischemic infarcts. No associated hemorrhage or mass effect. 2. No other acute intracranial abnormality. 3. Moderately advanced  age-related cerebral atrophy. Electronically Signed   By: Rise Mu M.D.   On: 12/29/2021 23:08   ECHOCARDIOGRAM LIMITED BUBBLE STUDY  Result Date: 12/30/2021    ECHOCARDIOGRAM LIMITED REPORT   Patient Name:   ZORIYAH WA Date of Exam: 12/30/2021 Medical Rec #:  [id number]            Height:       63.0 in Accession #:    [id number]           Weight:       130.7 lb Date of Birth:  1929-05-17             BSA:          1.614 m Patient Age:    88 years             BP:           158/74 mmHg Patient Gender: F                    HR:           57 bpm. Exam Location:  [facility] Procedure: Limited Echo, Limited Color Doppler, Cardiac Doppler and Saline            Contrast Bubble Study Indications:     G45.9 TIA  History:         Patient has no prior history of Echocardiogram examinations.                  Risk Factors:Dyslipidemia.  Sonographer:     Humphrey Rolls Referring Phys:  [id number] JAN A MANSY Diagnosing Phys: Julien Nordmann MD  Sonographer Comments: Suboptimal parasternal window. IMPRESSIONS  1. Left ventricular ejection fraction, by estimation, is 60 to 65%. The left ventricle has normal function. The left ventricle has no regional wall motion abnormalities.  2. Right ventricular systolic function is normal. The right ventricular size is normal.  3. The mitral valve is normal in structure.  4. The aortic valve was not well visualized. Aortic valve regurgitation is not visualized. No aortic stenosis is present.  5. The inferior vena cava is normal in size with greater than 50% respiratory variability, suggesting right atrial pressure of 3 mmHg.  6. Agitated saline contrast bubble study was negative, with no evidence of any interatrial shunt. FINDINGS  Left Ventricle: Left ventricular ejection fraction, by estimation, is 60 to 65%. The left ventricle has normal function. The left ventricle has no regional wall motion abnormalities. The left ventricular internal cavity size was normal in size. There is  no  left ventricular hypertrophy. Right Ventricle: The right ventricular size is normal. No increase in right ventricular wall thickness. Right ventricular systolic function is normal. Left Atrium: Left atrial size was normal in size. Right Atrium: Right atrial size was normal in size. Pericardium: There is no  evidence of pericardial effusion. Mitral Valve: The mitral valve is normal in structure. Tricuspid Valve: The tricuspid valve is normal in structure. Aortic Valve: The aortic valve was not well visualized. Aortic valve regurgitation is not visualized. No aortic stenosis is present. Pulmonic Valve: The pulmonic valve was not well visualized. Pulmonic valve regurgitation is not visualized. No evidence of pulmonic stenosis. Aorta: The aortic root was not well visualized and the ascending aorta was not well visualized. Venous: The pulmonary veins were not well visualized. The inferior vena cava was not well visualized. The inferior vena cava is normal in size with greater than 50% respiratory variability, suggesting right atrial pressure of 3 mmHg. IAS/Shunts: No atrial level shunt detected by color flow Doppler. Agitated saline contrast was given intravenously to evaluate for intracardiac shunting. Agitated saline contrast bubble study was negative, with no evidence of any interatrial shunt. LEFT VENTRICLE PLAX 2D LVIDd:         4.41 cm LVIDs:         2.63 cm LV PW:         1.03 cm LV IVS:        0.93 cm  LEFT ATRIUM         Index LA diam:    2.70 cm 1.67 cm/m Julien Nordmannimothy Gollan MD Electronically signed by Julien Nordmannimothy Gollan MD Signature Date/Time: 12/30/2021/2:44:42 PM    Final     Microbiology: Results for orders placed or performed during the hospital encounter of 12/29/21  Resp Panel by RT-PCR (Flu A&B, Covid) Nasopharyngeal Swab     Status: None   Collection Time: 12/29/21  7:02 PM   Specimen: Nasopharyngeal Swab; Nasopharyngeal(NP) swabs in vial transport medium  Result Value Ref Range Status   SARS Coronavirus  2 by RT PCR NEGATIVE NEGATIVE Final    Comment: (NOTE) SARS-CoV-2 target nucleic acids are NOT DETECTED.  The SARS-CoV-2 RNA is generally detectable in upper respiratory specimens during the acute phase of infection. The lowest concentration of SARS-CoV-2 viral copies this assay can detect is 138 copies/mL. A negative result does not preclude SARS-Cov-2 infection and should not be used as the sole basis for treatment or other patient management decisions. A negative result may occur with  improper specimen collection/handling, submission of specimen other than nasopharyngeal swab, presence of viral mutation(s) within the areas targeted by this assay, and inadequate number of viral copies(<138 copies/mL). A negative result must be combined with clinical observations, patient history, and epidemiological information. The expected result is Negative.  Fact Sheet for Patients:  BloggerCourse.comhttps://www.fda.gov/media/152166/download  Fact Sheet for Healthcare Providers:  SeriousBroker.ithttps://www.fda.gov/media/152162/download  This test is no t yet approved or cleared by the [place] FDA and  has been authorized for detection and/or diagnosis of SARS-CoV-2 by FDA under an Emergency Use Authorization (EUA). This EUA will remain  in effect (meaning this test can be used) for the duration of the COVID-19 declaration under Section 564(b)(1) of the Act, 21 U.S.C.section 360bbb-3(b)(1), unless the authorization is terminated  or revoked sooner.       Influenza A by PCR NEGATIVE NEGATIVE Final   Influenza B by PCR NEGATIVE NEGATIVE Final    Comment: (NOTE) The Xpert Xpress SARS-CoV-2/FLU/RSV plus assay is intended as an aid in the diagnosis of influenza from Nasopharyngeal swab specimens and should not be used as a sole basis for treatment. Nasal washings and aspirates are unacceptable for Xpert Xpress SARS-CoV-2/FLU/RSV testing.  Fact Sheet for Patients: BloggerCourse.comhttps://www.fda.gov/media/152166/download  Fact  Sheet for Healthcare Providers: SeriousBroker.ithttps://www.fda.gov/media/152162/download  This  test is not yet approved or cleared by the [place] and has been authorized for detection and/or diagnosis of SARS-CoV-2 by FDA under an Emergency Use Authorization (EUA). This EUA will remain in effect (meaning this test can be used) for the duration of the COVID-19 declaration under Section 564(b)(1) of the Act, 21 U.S.C. section 360bbb-3(b)(1), unless the authorization is terminated or revoked.  Performed at [facility], [address] [place], [place] [address]     Labs: CBC: Recent Labs  Lab 12/29/21 1631 12/30/21 0457  WBC 7.4 8.5  NEUTROABS 3.7  --   HGB 13.4 11.9*  HCT 40.3 36.1  MCV 93.1 91.2  PLT 205 190   Basic Metabolic Panel: Recent Labs  Lab 12/29/21 1631 12/30/21 0457  NA 141 140  K 3.3* 3.3*  CL 108 107  CO2 28 27  GLUCOSE 114* 104*  BUN 12 11  CREATININE 0.67 0.58  CALCIUM 8.9 8.4*   Liver Function Tests: Recent Labs  Lab 12/29/21 1631  AST 25  ALT 16  ALKPHOS 62  BILITOT 0.7  PROT 6.8  ALBUMIN 4.1   CBG: No results for input(s): GLUCAP in the last 168 hours.  Discharge time spent: 40 minutes.  Signed: Alberteen Sam, MD [facility] 12/30/2021

## 2021-12-30 NOTE — Evaluation (Addendum)
Speech Language Pathology Evaluation Patient Details Name: Allison Madden MRN: [id number] DOB: 1929/03/14 Today's Date: 12/30/2021 Time: [id number] SLP Time Calculation (min) (ACUTE ONLY): 20 min  Problem List:  Patient Active Problem List   Diagnosis Date Noted   Hypokalemia 12/30/2021   Dementia without behavioral disturbance ([place]) 12/30/2021   Acute ischemic stroke ([place]) 12/29/2021   Scoliosis 05/14/2018   Status post total replacement of hip 05/14/2018   Primary osteoarthritis of right hip 03/04/2018   Low serum vitamin D 08/03/2017   Hyperlipidemia, mixed 07/03/2017   Recurrent UTI 07/16/2014   Past Medical History:  Past Medical History:  Diagnosis Date   Hyperlipidemia    Macular degeneration    Past Surgical History:  Past Surgical History:  Procedure Laterality Date   EYE SURGERY     TONSILLECTOMY     TOTAL HIP ARTHROPLASTY Right 05/14/2018   Procedure: TOTAL HIP ARTHROPLASTY;  Surgeon: Dereck Leep, MD;  Location: [facility] ORS;  Service: Orthopedics;  Laterality: Right;   HPI:  Per [id number] H&P "Allison Madden is a 88 y.o. Caucasian female with medical history significant for dyslipidemia and macular degeneration, who presented to the emergency room with acute onset of left facial droop that started on Monday a.m. with no dysphagia and dysarthria as well as dizziness.  This was associated with right facial tingling and numbness that has resolved.  The patient has been having nausea and vomiting on Saturday and has been feeling weak all over with mild unsteady gait.  No urinary or stool incontinence.  No witnessed seizures.  No vertigo or tinnitus.    ED Course: Upon presentation to the emergency room, blood pressure was 157/89 with otherwise normal vital signs.  Potassium was 3.3 with otherwise unremarkable CMP and CBC.  Influenza antigens and COVID-19 PCR came back negative.  EKG as reviewed by me : EKG showed normal sinus rhythm with a rate of 71 with sinus  arrhythmia, left anterior fascicular block  Imaging: Noncontrast head CT scan revealed chronic atrophy and small vessel ischemic changes with no acute intracranial normalities.  C-spine CT showed diffuse degenerative change throughout the cervical spine with no acute findings.  Brain MRI without contrast revealed the following:  1. Two adjacent punctate foci of restricted diffusion involving the  left dorsal pons/pontomedullary junction, consistent with acute  ischemic infarcts. No associated hemorrhage or mass effect.  2. No other acute intracranial abnormality.  3. Moderately advanced age-related cerebral atrophy.    The patient was given 40 mill: P.o. potassium chloride.  She will be admitted to an observation medical telemetry bed for further evaluation and management."   Assessment / Plan / Recommendation Clinical Impression  Pt seen for speech/language evaluation. Full cognitive-linguistic evaluation deferred given hx of dementia. Pt assessed via informal means. Pt alert, pleasant, and cooperative. Daughter at bedside. Daughter denies pt with any changes to speech/language this admission.  Pt A&Ox4. Pt with intact verbal problem solving/reasoning. Pt speech is fluent, appropriate, and without s/sx anomia. Pt's speech is 100% intelligible despite L facial weakness/decreased ROM.  Pt with intact basic, primary language ability. Reduced auditory comprehension for complex information. Per daughter, this is baseline for pt.   Pt resides in ILF with 1:1 assistance from paid caregiver during waking hours. At d/c, daughter stated pt will have 24 hour supervision/assistance.   SLP to sign off as pt has no acute SLP needs. Suspect pt is at or near speech/language/cognitive baseline.   Pt and daughter made aware of results, recommendations,  and SLP POC. Both verbalized understanding/agreement. RN also made aware. Pt left in the care of RN.     SLP Assessment  SLP Recommendation/Assessment: Patient does not  need any further [facility] SLP Visit Diagnosis: Cognitive communication deficit (R41.841)    Recommendations for follow up therapy are one component of a multi-disciplinary discharge planning process, led by the attending physician.  Recommendations may be updated based on patient status, additional functional criteria and insurance authorization.    Follow Up Recommendations  No SLP follow up    Assistance Recommended at Discharge  Frequent or constant Supervision/Assistance  Functional Status Assessment Patient has not had a recent decline in their functional status  SLP Evaluation Cognition  Overall Cognitive Status: Within Functional Limits for tasks assessed Arousal/Alertness: Awake/alert Orientation Level: Oriented X4 Problem Solving: Appears intact (verbal) Safety/Judgment: Appears intact (verbal) Comments: deferred full cognitive assement due to baseline dementia       Comprehension  Auditory Comprehension Overall Auditory Comprehension: Impaired at baseline Yes/No Questions: Impaired Complex Questions:  (inconsistent; baseline per daughter) Commands: Within Functional Limits Conversation: Simple [facility]) Reading Comprehension Reading Status:  (DNT)    Expression Expression Primary Mode of Expression: Verbal Verbal Expression Overall Verbal Expression: Appears within functional limits for tasks assessed Initiation: No impairment Automatic Speech:  (WFL) Repetition: No impairment Naming: No impairment Pragmatics: No impairment Written Expression Dominant Hand: Left Written Expression:  (DNT)   Oral / Motor  Oral Motor/Sensory Function Overall Oral Motor/Sensory Function: Mild impairment Facial ROM: Reduced left Facial Symmetry: Abnormal symmetry left Lingual ROM: Within Functional Limits Lingual Strength: Within Functional Limits Velum: Within Functional Limits Mandible: Within Functional Limits Motor Speech Overall Motor Speech:  Appears within functional limits for tasks assessed Respiration: Within functional limits Resonance: Within functional limits Articulation: Within functional limitis Intelligibility: Intelligible Motor Planning: Witnin functional limits           Cherrie Gauze, M.S., [facility] [phone] [place])   Quintella Baton 12/30/2021, 10:56 AM

## 2021-12-30 NOTE — Consult Note (Signed)
Neurology Consultation  Reason for Consult: Stroke Referring Physician: Dr. Maryfrances Bunnellanford  CC: Left-sided facial droop, right-sided tingling and numbness, vomiting/nausea  History is obtained from: Patient, daughter, chart  HPI: Allison Madden is a 88 y.o. female past medical history of macular degeneration and hyperlipidemia, resident of a independent living facility with a caretaker at her side, using a walker to walk at baseline, presented to the emergency room for evaluation of neurological symptoms concerning for stroke.  She said that last Saturday, 12/25/2021 she had 6 to 8 hours worth of violent vomiting and nausea.  Initially they thought it is food poisoning but it got somewhat better.  She started noticing some tingling on the right face with her left side of the face being drawn down which was mild to begin with and that got somewhat worse and one of the caregivers noticed this and initiated evaluation in the hospital. She was seen in the emergency room and admitted for a stroke/TIA work-up given that most symptoms had resolved. On my examination today, other than mild dysarthria and left facial droop, I did not find any focal symptoms. Patient does not report any headaches. She has bilateral macular edema from which she is legally blind. She uses a walker to walk, has a caretaker who helps her, walks about a mile a day in between various rooms in the facility where she lives.  No memory issues.  No history of diabetes or hypertension.  Did not notice any tingling numbness or weakness on the arms or legs.   LKW: Sometime on 12/25/2021 tpa given?: no, way outside the window Premorbid modified Rankin scale (mRS): 3-can walk with her walker herself-a lot of disabilities from the macular degeneration.   ROS: Full ROS was performed and is negative except as noted in the HPI.   Past Medical History:  Diagnosis Date   Hyperlipidemia    Macular degeneration    No family history on  file.   Social History:   reports that she quit smoking about 50 years ago. Her smoking use included cigarettes. She has a 10.00 pack-year smoking history. She has never used smokeless tobacco. She reports current alcohol use of about 7.0 standard drinks per week. She reports that she does not use drugs.  Medications  Current Facility-Administered Medications:    acetaminophen (TYLENOL) tablet 650 mg, 650 mg, Oral, Q6H PRN **OR** acetaminophen (TYLENOL) suppository 650 mg, 650 mg, Rectal, Q6H PRN, Mansy, Jan A, MD   amLODipine (NORVASC) tablet 5 mg, 5 mg, Oral, Daily, Danford, Earl Liteshristopher P, MD, 5 mg at 12/30/21 [id number]   aspirin EC tablet 81 mg, 81 mg, Oral, Daily, Danford, Earl Liteshristopher P, MD, 81 mg at 12/30/21 [id number]   atorvastatin (LIPITOR) tablet 40 mg, 40 mg, Oral, Daily, Danford, Earl Liteshristopher P, MD, 40 mg at 12/30/21 [id number]   cholecalciferol (VITAMIN D3) tablet 5,000 Units, 5,000 Units, Oral, Daily, Mansy, Jan A, MD, 5,000 Units at 12/30/21 [id number]   clobetasol ointment (TEMOVATE) 0.05 % 1 application, 1 application, Topical, BID, Mansy, Jan A, MD   enoxaparin (LOVENOX) injection 40 mg, 40 mg, Subcutaneous, Q24H, Mansy, Jan A, MD   feeding supplement (ENSURE ENLIVE / ENSURE PLUS) liquid 237 mL, 1 Bottle, Oral, BID BM, Mansy, Jan A, MD   feeding supplement (PRO-STAT SUGAR FREE 64) liquid 30 mL, 30 mL, Oral, BID, Mansy, Jan A, MD   magnesium hydroxide (MILK OF MAGNESIA) suspension 30 mL, 30 mL, Oral, Daily PRN, Mansy, Vernetta HoneyJan A, MD   multivitamin with minerals tablet  1 tablet, 1 tablet, Oral, Daily, Mansy, Jan A, MD, 1 tablet at 12/30/21 0925   ondansetron (ZOFRAN) tablet 4 mg, 4 mg, Oral, Q6H PRN **OR** ondansetron (ZOFRAN) injection 4 mg, 4 mg, Intravenous, Q6H PRN, Mansy, Jan A, MD   potassium chloride (KLOR-CON) packet 40 mEq, 40 mEq, Oral, Once, Mansy, Jan A, MD   potassium chloride (KLOR-CON) packet 40 mEq, 40 mEq, Oral, Once, Danford, Suann Larry, MD   traZODone (DESYREL) tablet 25 mg, 25 mg,  Oral, QHS PRN, Mansy, Arvella Merles, MD   Exam: Current vital signs: BP (!) 148/82 (BP Location: Left Arm)    Pulse (!) 55    Temp 98 F (36.7 C) (Oral)    Resp 16    Ht 5\' 3"  (1.6 m)    Wt 59.3 kg    SpO2 97%    BMI 23.16 kg/m  Vital signs in last 24 hours: Temp:  [98 F (36.7 C)-98.9 F (37.2 C)] 98 F (36.7 C) (02/09 0804) Pulse Rate:  [51-69] 55 (02/09 0804) Resp:  [16-19] 16 (02/09 0804) BP: (123-171)/(58-110) 148/82 (02/09 0804) SpO2:  [92 %-100 %] 97 % (02/09 0804) Weight:  [59.3 kg-61 kg] 59.3 kg (02/08 2237) General: Very pleasant elderly woman sitting in bed eating her breakfast in no acute distress. HEENT: Normocephalic atraumatic Lungs clear Cardiovascular: Regular rhythm Abdomen nondistended nontender Extremities warm well perfused Neurological exam She is awake alert oriented x3 Her speech is mildly dysarthric She has no evidence of aphasia Cranial nerves: Pupils equal round react light, extraocular movements intact, visual fields full, facial sensation intact, there is mild left lower facial weakness visible at rest but more pronounced when she smiles.  Auditory acuity reduced bilaterally.  Tongue and palate midline. Motor examination: There is no drift in any of the 4 extremities. Sensation intact to touch all over without extinction Coordination: Difficult to assess given her macular degeneration and difficulty focusing on my finger but no gross dysmetria based on the reach for objects. NIH stroke scale-2  Labs I have reviewed labs in epic and the results pertinent to this consultation are:   CBC    Component Value Date/Time   WBC 8.5 12/30/2021 0457   RBC 3.96 12/30/2021 0457   HGB 11.9 (L) 12/30/2021 0457   HCT 36.1 12/30/2021 0457   PLT 190 12/30/2021 0457   MCV 91.2 12/30/2021 0457   MCH 30.1 12/30/2021 0457   MCHC 33.0 12/30/2021 0457   RDW 12.8 12/30/2021 0457   LYMPHSABS 2.4 12/29/2021 1631   MONOABS 0.8 12/29/2021 1631   EOSABS 0.3 12/29/2021 1631    BASOSABS 0.1 12/29/2021 1631    CMP     Component Value Date/Time   NA 140 12/30/2021 0457   K 3.3 (L) 12/30/2021 0457   CL 107 12/30/2021 0457   CO2 27 12/30/2021 0457   GLUCOSE 104 (H) 12/30/2021 0457   BUN 11 12/30/2021 0457   CREATININE 0.58 12/30/2021 0457   CALCIUM 8.4 (L) 12/30/2021 0457   PROT 6.8 12/29/2021 1631   ALBUMIN 4.1 12/29/2021 1631   AST 25 12/29/2021 1631   ALT 16 12/29/2021 1631   ALKPHOS 62 12/29/2021 1631   BILITOT 0.7 12/29/2021 1631   GFRNONAA >60 12/30/2021 0457   GFRAA >60 05/17/2018 0600    Lipid Panel     Component Value Date/Time   CHOL 150 12/30/2021 0457   TRIG 68 12/30/2021 0457   HDL 54 12/30/2021 0457   CHOLHDL 2.8 12/30/2021 0457   VLDL 14  12/30/2021 0457   LDLCALC 82 12/30/2021 0457     Imaging I have reviewed the images obtained: MRI brain with 2 adjacent punctate foci of restricted diffusion involving the left dorsal pons/pontomedullary junction consistent with acute ischemic infarct.  No hemorrhage.  Moderately advanced age-related cerebral atrophy.  Assessment:  88 year old woman presenting for evaluation of left-sided facial droop, right-sided facial numbness and preceding episode of nausea and vomiting with symptoms that started last Saturday, on imaging noted to have a left dorsal pontine/pontomedullary junction acute infarct. I suspect that her nausea and vomiting was probably related to the stroke and that is when the stroke happened on Saturday.  The facial droop has been present since then but was more appreciable in the day or so prior to presentation. Does not have a history of diabetes or hypertension.  Needs full stroke risk factor work-up. Not on aspirin or Plavix at home.   Impression: Acute ischemic infarct-likely small vessel etiology Evaluate for large vessel or cardioembolic source  Recommendations: -Frequent neurochecks - 2D echo-completed-results pending - LDL 82-goal less than 70.  Given her advanced  age, she is currently on pravastatin 40 at home.  I will continue her on her current statin to avoid side effects. -CTA head and neck-ordered and pending.  Will follow. - PT OT speech therapy - Permissive hypertension not required as the last known well is about 5 to 6 days ago. - Preventive treatment: Aspirin 81.  If any concern for atrial fibrillation, will need anticoagulation. - Would recommend a 30-day outpatient cardiac monitor. I will follow-up the echo and CTA head and neck results with you. Preliminary plan relayed to Dr. Loleta Books.  Addendum CTA head/neck IMPRESSION: 1. No large vessel occlusion or proximal hemodynamically significant stenosis in the head or neck. 2. Approximately 7 mm ground-glass opacity in the lateral aspect of the imaged left lower lobe and subtle 6 mm ground-glass opacity in the right upper lobe. Findings most likely are infectious or inflammatory, but recommend follow-up chest CT in approximately 3-6 months to ensure resolution and exclude malignancy. Recommendation follows the consensus statement: Guidelines for Management of Incidental Pulmonary Nodules Detected on CT Images: From the Fleischner Society 2017; Radiology 2017; 284:228-243.  2D Echo LVEF 60-65%. MV normal. AV not well visualized. No evidence of IAS. LA normal size.  Recs as above. Relayed plan to Dr. Loleta Books. Please call with questions. -- Amie Portland, MD Neurologist [facility] Pager: [phone]

## 2021-12-30 NOTE — Plan of Care (Signed)
°  Problem: Education: Goal: Knowledge of General Education information will improve Description: Including pain rating scale, medication(s)/side effects and non-pharmacologic comfort measures Outcome: Progressing   Problem: Health Behavior/Discharge Planning: Goal: Ability to manage health-related needs will improve Outcome: Progressing   Problem: Clinical Measurements: Goal: Ability to maintain clinical measurements within normal limits will improve Outcome: Progressing Goal: Will remain free from infection Outcome: Progressing Goal: Diagnostic test results will improve Outcome: Progressing Goal: Respiratory complications will improve Outcome: Progressing Goal: Cardiovascular complication will be avoided Outcome: Progressing   Problem: Activity: Goal: Risk for activity intolerance will decrease Outcome: Progressing   Problem: Nutrition: Goal: Adequate nutrition will be maintained Outcome: Progressing   Problem: Coping: Goal: Level of anxiety will decrease Outcome: Progressing   Problem: Elimination: Goal: Will not experience complications related to bowel motility Outcome: Progressing Goal: Will not experience complications related to urinary retention Outcome: Progressing   Problem: Pain Managment: Goal: General experience of comfort will improve Outcome: Progressing   Problem: Safety: Goal: Ability to remain free from injury will improve Outcome: Progressing   Problem: Skin Integrity: Goal: Risk for impaired skin integrity will decrease Outcome: Progressing   Problem: Education: Goal: Knowledge of disease or condition will improve Outcome: Progressing Goal: Knowledge of secondary prevention will improve (SELECT ALL) Outcome: Progressing Goal: Knowledge of patient specific risk factors will improve (INDIVIDUALIZE FOR PATIENT) Outcome: Progressing   Problem: Coping: Goal: Will verbalize positive feelings about self Outcome: Progressing Goal: Will  identify appropriate support needs Outcome: Progressing   Problem: Health Behavior/Discharge Planning: Goal: Ability to manage health-related needs will improve Outcome: Progressing   Problem: Self-Care: Goal: Ability to participate in self-care as condition permits will improve Outcome: Progressing Goal: Verbalization of feelings and concerns over difficulty with self-care will improve Outcome: Progressing Goal: Ability to communicate needs accurately will improve Outcome: Progressing   Problem: Nutrition: Goal: Risk of aspiration will decrease Outcome: Progressing Goal: Dietary intake will improve Outcome: Progressing   Problem: Intracerebral Hemorrhage Tissue Perfusion: Goal: Complications of Intracerebral Hemorrhage will be minimized Outcome: Progressing   Problem: Ischemic Stroke/TIA Tissue Perfusion: Goal: Complications of ischemic stroke/TIA will be minimized Outcome: Progressing   Problem: Spontaneous Subarachnoid Hemorrhage Tissue Perfusion: Goal: Complications of Spontaneous Subarachnoid Hemorrhage will be minimized Outcome: Progressing

## 2021-12-30 NOTE — Assessment & Plan Note (Signed)
Had subcentimeter left lateral medullary infarct, consistent with dizziness, ataxia, left facial droop and right facial tingling. -Non-invasive angiography showed no significant intracranial atherosclorosis -Echocardiogram showed no cardiogenic source of embolism -Carotid imaging unremarkable   -Lipids ordered: discharged on pravastatin -Aspirin ordered at discharge: aspirin 81 mg daily -Atrial fibrillation: Not present on hospital monitoring, RECOMMEND CARDIAC MONITOR -tPA not given because outside the window -Dysphagia screen ordered in ER -PT eval ordered: recommended HH PT/OT -Smoking cessation: not pertinent

## 2021-12-30 NOTE — Hospital Course (Signed)
Allison Madden is a 88 y.o. F with dyslipidemia and macular degeneration, who presented to the emergency room with acute onset of left facial droop that started 2 days prior to presentation with no dysphagia and dysarthria as well as dizziness.  This was associated with right facial tingling and numbness that has resolved.  In the ER, K 3.3.  CT head unremarkable.  Brain MRI showed two small punctate foci of acute infarct in the left dorsal pons/pontomedullary junction.

## 2021-12-30 NOTE — Evaluation (Signed)
Physical Therapy Evaluation Patient Details Name: Allison Madden MRN: [id number] DOB: 09-20-1929 Today's Date: 12/30/2021  History of Present Illness  Allison Madden is a 88 y.o. Caucasian female with medical history significant for dyslipidemia and macular degeneration, who presented to the emergency room with acute onset of left facial droop that started on Monday a.m. with no dysphagia and dysarthria as well as dizziness.  This was associated with right facial tingling and numbness that has resolved.  The patient has been having nausea and vomiting on Saturday and has been feeling weak all over with mild unsteady gait.  No urinary or stool incontinence.  No witnessed seizures.  No vertigo or tinnitus. MRI reveals acute infarcts.   Clinical Impression  Patient received in bed, she is agreeable to PT assessment. Daughter present at bedside. She is mod independent with bed mobility, transfers with min assist for safety and balance. She is able to ambulate 25 feet in room with RW, min guard and cues for safety, obstacle avoidance due to poor vision. Patient will continue to benefit from skilled PT while here to improve mobility, safety and independence.         Recommendations for follow up therapy are one component of a multi-disciplinary discharge planning process, led by the attending physician.  Recommendations may be updated based on patient status, additional functional criteria and insurance authorization.  Follow Up Recommendations Home health PT    Assistance Recommended at Discharge Frequent or constant Supervision/Assistance  Patient can return home with the following  A little help with walking and/or transfers;A little help with bathing/dressing/bathroom;Assistance with cooking/housework;Assist for transportation    Equipment Recommendations None recommended by PT  Recommendations for Other Services       Functional Status Assessment Patient has had a recent decline in  their functional status and demonstrates the ability to make significant improvements in function in a reasonable and predictable amount of time.     Precautions / Restrictions Precautions Precautions: Fall Restrictions Weight Bearing Restrictions: No      Mobility  Bed Mobility Overal bed mobility: Modified Independent                  Transfers Overall transfer level: Needs assistance Equipment used: Rolling walker (2 wheels) Transfers: Sit to/from Stand Sit to Stand: Min guard                Ambulation/Gait Ambulation/Gait assistance: Min guard Gait Distance (Feet): 25 Feet Assistive device: Rolling walker (2 wheels) Gait Pattern/deviations: Step-through pattern, Decreased step length - right, Decreased step length - left, Decreased stride length, Trunk flexed, Shuffle Gait velocity: decr     General Gait Details: patient ambulated with RW to bathroom and back. requires min guard for safety due to visual deficits and balance.  Stairs            Wheelchair Mobility    Modified Rankin (Stroke Patients Only)       Balance Overall balance assessment: Needs assistance Sitting-balance support: Feet supported Sitting balance-Leahy Scale: Good     Standing balance support: Bilateral upper extremity supported, During functional activity, Reliant on assistive device for balance Standing balance-Leahy Scale: Fair Standing balance comment: needs B UE support, min guard for safety                             Pertinent Vitals/Pain Pain Assessment Pain Assessment: No/denies pain    Home Living Family/patient expects to be  discharged to:: Private residence Living Arrangements: Alone Available Help at Discharge: Personal care attendant;Available 24 hours/day Type of Home: Independent living facility Home Access: Level entry       Home Layout: One level Home Equipment: Rollator (4 wheels)      Prior Function Prior Level of Function :  Independent/Modified Independent             Mobility Comments: ambualtes with rollator at baseline. Has caregivers ADLs Comments: has caregivers assist if needed     Hand Dominance   Dominant Hand: Left    Extremity/Trunk Assessment   Upper Extremity Assessment Upper Extremity Assessment: Defer to OT evaluation    Lower Extremity Assessment Lower Extremity Assessment: Generalized weakness    Cervical / Trunk Assessment Cervical / Trunk Assessment: Kyphotic  Communication   Communication: No difficulties  Cognition Arousal/Alertness: Awake/alert Behavior During Therapy: WFL for tasks assessed/performed Overall Cognitive Status: Within Functional Limits for tasks assessed                                 General Comments: pleasant        General Comments      Exercises     Assessment/Plan    PT Assessment Patient needs continued PT services  PT Problem List Decreased strength;Decreased mobility;Decreased activity tolerance;Decreased balance       PT Treatment Interventions Therapeutic exercise;Gait training;Balance training;Functional mobility training;Therapeutic activities;Patient/family education;DME instruction    PT Goals (Current goals can be found in the Care Plan section)  Acute Rehab PT Goals Patient Stated Goal: to return to [facility] with caregivers PT Goal Formulation: With patient/family Time For Goal Achievement: 01/13/22 Potential to Achieve Goals: Good    Frequency Min 2X/week     Co-evaluation               AM-PAC PT "6 Clicks" Mobility  Outcome Measure Help needed turning from your back to your side while in a flat bed without using bedrails?: A Little Help needed moving from lying on your back to sitting on the side of a flat bed without using bedrails?: A Little Help needed moving to and from a bed to a chair (including a wheelchair)?: A Little Help needed standing up from a chair using your arms (e.g.,  wheelchair or bedside chair)?: A Little Help needed to walk in hospital room?: A Little Help needed climbing 3-5 steps with a railing? : A Lot 6 Click Score: 17    End of Session Equipment Utilized During Treatment: Gait belt Activity Tolerance: Patient tolerated treatment well Patient left: in bed;with call bell/phone within reach;with bed alarm set;with family/visitor present Nurse Communication: Mobility status PT Visit Diagnosis: Unsteadiness on feet (R26.81);Muscle weakness (generalized) (M62.81)    Time: 1607-3710 PT Time Calculation (min) (ACUTE ONLY): 21 min   Charges:   PT Evaluation $PT Eval Moderate Complexity: 1 Mod PT Treatments $Gait Training: 8-22 mins        Latiqua Daloia, PT, GCS 12/30/21,11:49 AM

## 2021-12-30 NOTE — Progress Notes (Signed)
*  PRELIMINARY RESULTS* Echocardiogram 2D Echocardiogram has been performed.  Allison Madden 12/30/2021, 9:29 AM

## 2021-12-30 NOTE — Evaluation (Signed)
Occupational Therapy Evaluation Patient Details Name: Allison Madden MRN: [id number] DOB: [birth year] Today's Date: 12/30/2021   History of Present Illness Allison Madden is a 88 y.o. Caucasian female with medical history significant for dyslipidemia and macular degeneration, who presented to the emergency room with acute onset of left facial droop that started on Monday a.m. with no dysphagia and dysarthria as well as dizziness.  This was associated with right facial tingling and numbness that has resolved.  The patient has been having nausea and vomiting on Saturday and has been feeling weak all over with mild unsteady gait.  No urinary or stool incontinence.  No witnessed seizures.  No vertigo or tinnitus. MRI reveals acute infarcts.   Clinical Impression   Patient presenting with decreased Ind in self care, balance, functional mobility/transfers, endurance, and safety awareness. Patient's daughter present to confirm baseline. Pt reports being mod I with use of rollator at baseline. She is very active and living in an ILF. She walks ~ 1 mile daily and has caregiver secondary to decreased vision. Pt is very pleasant and motivated. Min guard - min A for functional mobility with pt initially needing min A secondary to posterior bias but was able to self correct as session continued. Min A for LB clothing management and min guard for ambulation with RW. Daughter reports caregiver will be able to stay with pt for 24/7 upon return.  Patient will benefit from acute OT to increase overall independence in the areas of ADLs, functional mobility, and safety awareness in order to safely discharge home.     Recommendations for follow up therapy are one component of a multi-disciplinary discharge planning process, led by the attending physician.  Recommendations may be updated based on patient status, additional functional criteria and insurance authorization.   Follow Up Recommendations  Home health OT     Assistance Recommended at Discharge Frequent or constant Supervision/Assistance  Patient can return home with the following A little help with walking and/or transfers;A little help with bathing/dressing/bathroom;Help with stairs or ramp for entrance;Assist for transportation    Functional Status Assessment  Patient has had a recent decline in their functional status and demonstrates the ability to make significant improvements in function in a reasonable and predictable amount of time.  Equipment Recommendations  None recommended by OT       Precautions / Restrictions Precautions Precautions: Fall Restrictions Weight Bearing Restrictions: No      Mobility Bed Mobility Overal bed mobility: Modified Independent                  Transfers Overall transfer level: Needs assistance Equipment used: Rolling walker (2 wheels) Transfers: Sit to/from Stand Sit to Stand: Min assist, Min guard                  Balance Overall balance assessment: Needs assistance Sitting-balance support: Feet supported Sitting balance-Leahy Scale: Good     Standing balance support: Bilateral upper extremity supported, During functional activity, Reliant on assistive device for balance Standing balance-Leahy Scale: Fair Standing balance comment: needs B UE support, min guard for safety                           ADL either performed or assessed with clinical judgement   ADL Overall ADL's : Needs assistance/impaired  General ADL Comments: supervision - min A with use of RW this session.     Vision Baseline Vision/History: 6 Macular Degeneration Ability to See in Adequate Light: 2 Moderately impaired Patient Visual Report: No change from baseline              Pertinent Vitals/Pain Pain Assessment Pain Assessment: No/denies pain     Hand Dominance Left   Extremity/Trunk Assessment Upper Extremity  Assessment Upper Extremity Assessment: Generalized weakness   Lower Extremity Assessment Lower Extremity Assessment: Generalized weakness   Cervical / Trunk Assessment Cervical / Trunk Assessment: Kyphotic   Communication Communication Communication: No difficulties   Cognition Arousal/Alertness: Awake/alert Behavior During Therapy: WFL for tasks assessed/performed Overall Cognitive Status: Within Functional Limits for tasks assessed                                 General Comments: pleasant and cooperative throughout                Home Living Family/patient expects to be discharged to:: Other (Comment) (ILF) Living Arrangements: Alone Available Help at Discharge: Personal care attendant;Available 24 hours/day Type of Home: Independent living facility Home Access: Level entry     Home Layout: One level     Bathroom Shower/Tub: Producer, television/film/video: Standard     Home Equipment: Rollator (4 wheels);Shower seat;BSC/3in1      Lives With: Alone    Prior Functioning/Environment Prior Level of Function : Independent/Modified Independent             Mobility Comments: ambualtes with rollator at baseline. Has caregivers ADLs Comments: has caregivers assist if needed but is independent at baseline with bathing and dressing.        OT Problem List: Decreased strength;Decreased activity tolerance;Impaired balance (sitting and/or standing);Decreased safety awareness;Decreased knowledge of precautions;Decreased knowledge of use of DME or AE;Decreased cognition;Cardiopulmonary status limiting activity;Pain      OT Treatment/Interventions: Self-care/ADL training;Therapeutic exercise;Therapeutic activities;Modalities;Balance training;DME and/or AE instruction;Neuromuscular education;Energy conservation;Patient/family education    OT Goals(Current goals can be found in the care plan section) Acute Rehab OT Goals Patient Stated Goal: to go  home OT Goal Formulation: With patient/family Time For Goal Achievement: 01/13/22 Potential to Achieve Goals: Fair ADL Goals Pt Will Perform Grooming: with modified independence;standing Pt Will Perform Lower Body Dressing: with modified independence;sit to/from stand Pt Will Transfer to Toilet: with modified independence;ambulating Pt Will Perform Toileting - Clothing Manipulation and hygiene: with modified independence;sit to/from stand  OT Frequency: Min 2X/week       AM-PAC OT "6 Clicks" Daily Activity     Outcome Measure Help from another person eating meals?: None Help from another person taking care of personal grooming?: A Little Help from another person toileting, which includes using toliet, bedpan, or urinal?: A Little Help from another person bathing (including washing, rinsing, drying)?: A Little Help from another person to put on and taking off regular upper body clothing?: None Help from another person to put on and taking off regular lower body clothing?: A Little 6 Click Score: 20   End of Session Equipment Utilized During Treatment: Rolling walker (2 wheels) Nurse Communication: Mobility status  Activity Tolerance: Patient tolerated treatment well Patient left: in bed;with call bell/phone within reach;with bed alarm set  OT Visit Diagnosis: Unsteadiness on feet (R26.81);Muscle weakness (generalized) (M62.81);History of falling (Z91.81)  Time: 5465-6812 OT Time Calculation (min): 21 min Charges:  OT General Charges $OT Visit: 1 Visit OT Evaluation $OT Eval Moderate Complexity: 1 Mod OT Treatments $Self Care/Home Management : 8-22 mins  Jackquline Denmark, MS, OTR/L , CBIS ascom [phone]  12/30/21, 12:35 PM

## 2021-12-31 ENCOUNTER — Ambulatory Visit: Payer: Self-pay

## 2021-12-31 DIAGNOSIS — I499 Cardiac arrhythmia, unspecified: Secondary | ICD-10-CM

## 2021-12-31 DIAGNOSIS — I639 Cerebral infarction, unspecified: Secondary | ICD-10-CM

## 2021-12-31 NOTE — Telephone Encounter (Signed)
Line busy with no voicemail 

## 2021-12-31 NOTE — Telephone Encounter (Signed)
Reviewed recommendations of ED provider to send monitor and schedule for follow up. Monitor ordered to be mailed to address on file and appointment scheduled for 01/31/22. Daughter verbalized understanding of instructions, and recommendations with no further questions at this time.

## 2022-01-04 NOTE — Telephone Encounter (Signed)
Spoke with patients son and he states they do not want to proceed with heart monitor or appointment at this time. He stated they are trying to handle this situation and if they should change their mind they will call back. Instructed him to just mail monitor back and that I would update provider on their wishes. He was appreciative with no further questions at this time.

## 2022-01-31 ENCOUNTER — Ambulatory Visit: Payer: Medicare Other | Admitting: Cardiovascular Disease

## 2023-03-28 ENCOUNTER — Emergency Department
Admission: EM | Admit: 2023-03-28 | Discharge: 2023-03-28 | Discharge status: Against Medical Advice | Payer: Medicare Other | Attending: Emergency Medicine | Admitting: Emergency Medicine

## 2023-03-28 DIAGNOSIS — Z5321 Procedure and treatment not carried out due to patient leaving prior to being seen by health care provider: Secondary | ICD-10-CM | POA: Insufficient documentation

## 2023-03-28 DIAGNOSIS — R456 Violent behavior: Secondary | ICD-10-CM | POA: Diagnosis present
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
## 2023-11-06 IMAGING — CT CT ANGIO HEAD-NECK (W OR W/O PERF)
1 of 11 series · 5 of 34 positions shown · non-contrast
Comparison: CT and MRI head December 29, 2021.

CLINICAL DATA: Neuro deficit, acute, stroke suspected

EXAM:
CT ANGIOGRAPHY HEAD AND NECK
TECHNIQUE: Multidetector CT imaging of the head and neck was performed using
the standard protocol during bolus administration of intravenous
contrast. Multiplanar CT image reconstructions and MIPs were
obtained to evaluate the vascular anatomy. Carotid stenosis
measurements (when applicable) are obtained utilizing NASCET
criteria, using the distal internal carotid diameter as the
denominator.

[Series 11: ax thin · axial · 0.47mm/px · z∈[+252,+460]mm · 5 of 313 slices shown]
[im 53/313  soft-tissue]
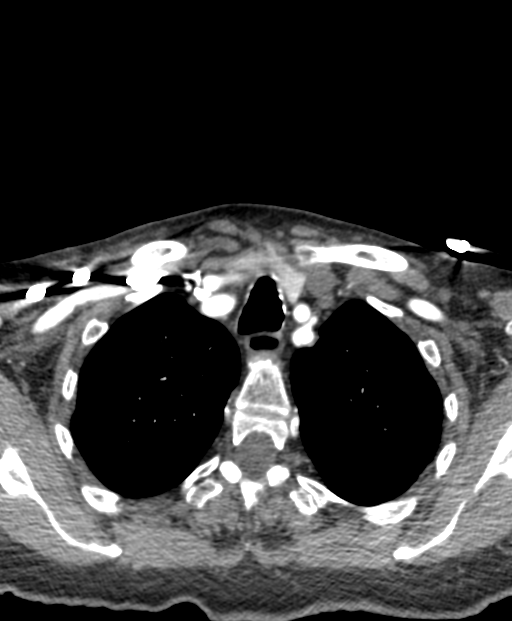
[im 105/313  bone]
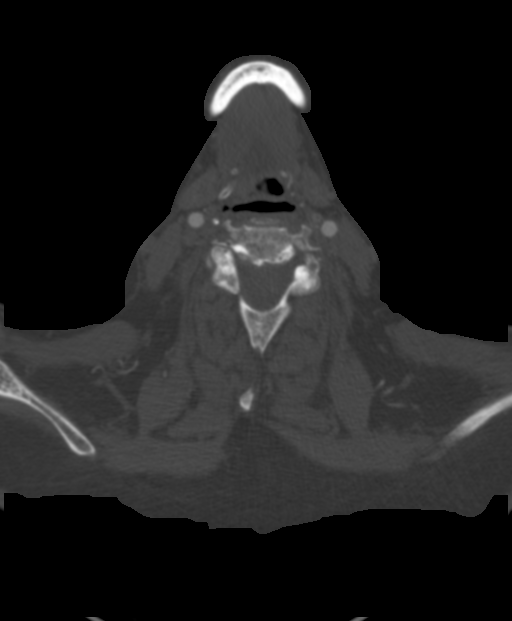
[im 157/313  soft-tissue]
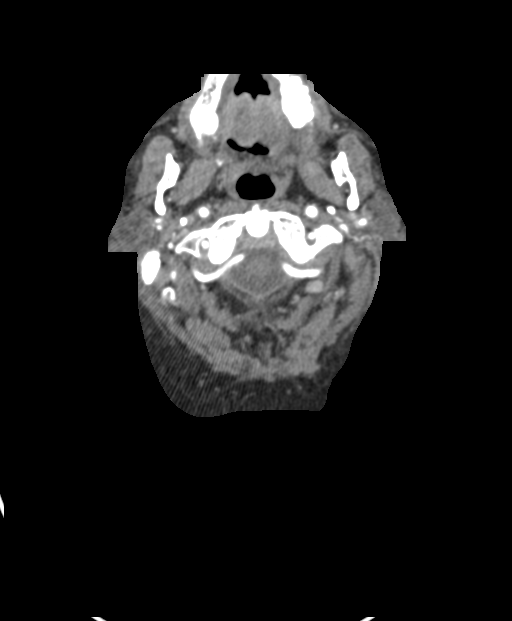
[im 209/313  bone]
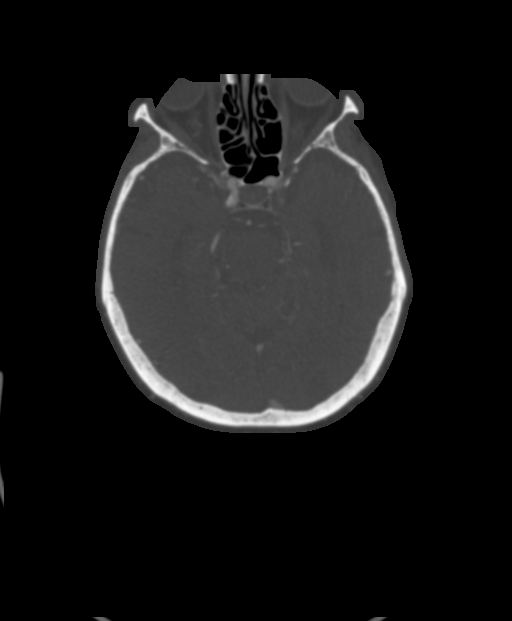
[im 261/313  soft-tissue]
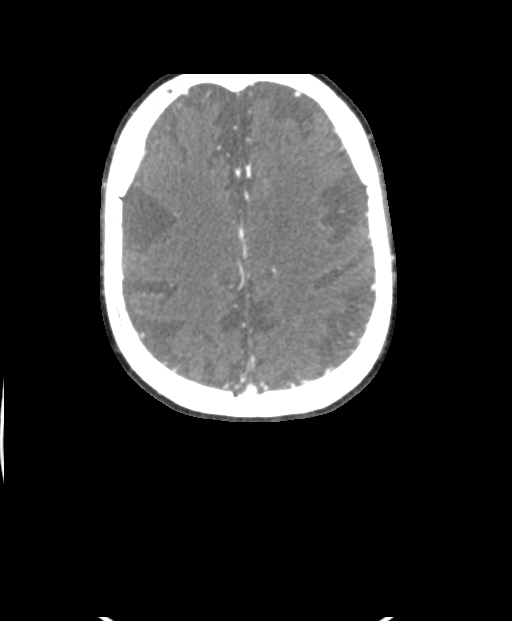

[5 of 34 positions shown; findings below may reference images not displayed]

RADIATION DOSE REDUCTION: This exam was performed according to the
departmental dose-optimization program which includes automated
exposure control, adjustment of the mA and/or kV according to
patient size and/or use of iterative reconstruction technique.

CONTRAST:  75mL OMNIPAQUE IOHEXOL 350 MG/ML SOLN
FINDINGS: CT HEAD FINDINGS

Brain: Known punctate infarcts seen on recent MRI are not visible by
CT. No evidence of acute hemorrhage, hydrocephalus, mass lesion,
midline shift, or extra-axial fluid. Similar chronic microvascular
ischemic disease and atrophy.

Vascular: See below.

Skull: No acute fracture.

Sinuses: Clear sinuses.

Orbits: No acute finding.

Review of the MIP images confirms the above findings

CTA NECK FINDINGS

Aortic arch: Great vessel origins are patent.  Atherosclerosis.

Right carotid system: Patent. Mild atherosclerosis at the carotid
bifurcation without greater than 50% stenosis.

Left carotid system: Patent. Mild atherosclerosis at the carotid
bifurcation without greater than 50% stenosis.

Vertebral arteries: Patent. No significant (greater than 50%)
stenosis.

Skeleton: Multilevel degenerative disc disease and facet arthropathy
with varying degrees of neural foraminal stenosis.

Other neck: No acute abnormality on limited assessment.

Upper chest: Approximately 7 mm ground-glass opacity in the lateral
aspect of the imaged left lower lobe and subtle 6 mm ground-glass
opacity in the right upper lobe.

Review of the MIP images confirms the above findings

CTA HEAD FINDINGS

Anterior circulation: Bilateral intracranial ICAs, MCAs, and ACAs
are patent without proximal hemodynamically significant stenosis. No
aneurysm identified.

Posterior circulation: Bilateral intradural vertebral arteries,
basilar artery, and posterior cerebral arteries are patent without
proximal hemodynamically significant stenosis. Small vertebrobasilar
system with prominent bilateral posterior communicating arteries and
small bilateral P1 PCAs, anatomic variant. The visualized PICAS and
superior cerebellar arteries are patent proximally. No aneurysm
identified.

Venous sinuses: As permitted by contrast timing, patent.

Anatomic variants: Detailed above.

Review of the MIP images confirms the above findings
IMPRESSION: 1. No large vessel occlusion or proximal hemodynamically significant
stenosis in the head or neck.
2. Approximately 7 mm ground-glass opacity in the lateral aspect of
the imaged left lower lobe and subtle 6 mm ground-glass opacity in
the right upper lobe. Findings most likely are infectious or
inflammatory, but recommend follow-up chest CT in approximately 3-6
months to ensure resolution and exclude malignancy. Recommendation
follows the consensus statement: Guidelines for Management of
Incidental Pulmonary Nodules Detected on CT Images: From the

## 2024-08-12 NOTE — Progress Notes (Signed)
 ENCOUNTER: Patient Class :No patient class for patient encounter Department: [facility] [address] [place] [address]  PATIENT: Patient Demographics      Name Patient ID SSN Gender Identity Birth Date   Allison Madden, Allison Madden [id number] Female Aug 12, 2029 (89 yrs)          Address Phone Email       [address] [place] [address] [place] [address] [phone] [email or web address]) [email or web address]            [place] Race         [place] Caucasian/White             Reg Status PCP Date Last Verified Next Review Date     ELAPSED Cleotilde Oneil Novel [id number] 07/14/23 08/13/23           Marital Status Religion Language       Widowed Unknown-Patient Declined English              EMERGENCY CONTACT: Name Relationship Lgl Grd Work Administrator, sports  1. Nathanson,ROB Son or Daughter    [phone]  2. VIKTORIA DIAMOND Blades or Daughter    [phone]  3. JONES,BETTY Other    [phone]    GUARANTOR: There is no guarantor information entered for this encounter.  COVERAGE: Primary Visit Coverage      Payer Plan Group Number Group Name Payer Phone Plan Phone   No coverage found                Secondary Visit Coverage      Payer Plan Group Number Group Name Payer Phone Plan Phone   No coverage found                Primary Coverage      Payer Plan Group Number Group Name Payer Phone Plan Phone   [facility] MEDICARE ADVANTAGE PLAN [facility] MEDICARE ADVANTAGE [phone]              Primary Subscriber      Subscriber ID Subscriber Name Subscriber [facility] Subscriber Address   [id number] [facility] I [id number] [address]      [place], [place] [address]           Secondary Coverage      Payer Plan Group Number Group Name Payer Phone Plan Phone   No coverage found

## 2024-09-21 DEATH — deceased
